# Patient Record
Sex: Female | Born: 1945 | Race: White | Hispanic: No | Smoking: Current every day smoker
Health system: Southern US, Community
[De-identification: ages and names within clinical notes are randomized; demographics above are authoritative.]

## PROBLEM LIST (undated history)

## (undated) DIAGNOSIS — E119 Type 2 diabetes mellitus without complications: Secondary | ICD-10-CM

## (undated) DIAGNOSIS — I10 Essential (primary) hypertension: Secondary | ICD-10-CM

## (undated) DIAGNOSIS — I639 Cerebral infarction, unspecified: Secondary | ICD-10-CM

---

## 2013-07-08 ENCOUNTER — Emergency Department (HOSPITAL_COMMUNITY): Payer: Medicare Other

## 2013-07-08 ENCOUNTER — Emergency Department (HOSPITAL_COMMUNITY)
Admission: EM | Admit: 2013-07-08 | Discharge: 2013-07-08 | Discharge status: Against Medical Advice | Payer: Medicare Other | Attending: Emergency Medicine | Admitting: Emergency Medicine

## 2013-07-08 ENCOUNTER — Encounter (HOSPITAL_COMMUNITY): Payer: Self-pay | Admitting: Emergency Medicine

## 2013-07-08 DIAGNOSIS — E119 Type 2 diabetes mellitus without complications: Secondary | ICD-10-CM | POA: Insufficient documentation

## 2013-07-08 DIAGNOSIS — I10 Essential (primary) hypertension: Secondary | ICD-10-CM | POA: Insufficient documentation

## 2013-07-08 DIAGNOSIS — F17200 Nicotine dependence, unspecified, uncomplicated: Secondary | ICD-10-CM | POA: Insufficient documentation

## 2013-07-08 DIAGNOSIS — G459 Transient cerebral ischemic attack, unspecified: Secondary | ICD-10-CM | POA: Insufficient documentation

## 2013-07-08 HISTORY — DX: Essential (primary) hypertension: I10

## 2013-07-08 HISTORY — DX: Type 2 diabetes mellitus without complications: E11.9

## 2013-07-08 LAB — CBC WITH DIFFERENTIAL/PLATELET
BASOS ABS: 0 10*3/uL (ref 0.0–0.1)
BASOS PCT: 0 % (ref 0–1)
EOS ABS: 0 10*3/uL (ref 0.0–0.7)
EOS PCT: 0 % (ref 0–5)
HCT: 44.5 % (ref 36.0–46.0)
Hemoglobin: 15.6 g/dL — ABNORMAL HIGH (ref 12.0–15.0)
Lymphocytes Relative: 11 % — ABNORMAL LOW (ref 12–46)
Lymphs Abs: 2 10*3/uL (ref 0.7–4.0)
MCH: 31 pg (ref 26.0–34.0)
MCHC: 35.1 g/dL (ref 30.0–36.0)
MCV: 88.5 fL (ref 78.0–100.0)
Monocytes Absolute: 1.2 10*3/uL — ABNORMAL HIGH (ref 0.1–1.0)
Monocytes Relative: 7 % (ref 3–12)
NEUTROS PCT: 81 % — AB (ref 43–77)
Neutro Abs: 14 10*3/uL — ABNORMAL HIGH (ref 1.7–7.7)
PLATELETS: 360 10*3/uL (ref 150–400)
RBC: 5.03 MIL/uL (ref 3.87–5.11)
RDW: 12.6 % (ref 11.5–15.5)
WBC: 17.2 10*3/uL — ABNORMAL HIGH (ref 4.0–10.5)

## 2013-07-08 LAB — URINALYSIS, ROUTINE W REFLEX MICROSCOPIC
BILIRUBIN URINE: NEGATIVE
Glucose, UA: 100 mg/dL — AB
Hgb urine dipstick: NEGATIVE
KETONES UR: NEGATIVE mg/dL
Leukocytes, UA: NEGATIVE
NITRITE: NEGATIVE
Protein, ur: NEGATIVE mg/dL
SPECIFIC GRAVITY, URINE: 1.01 (ref 1.005–1.030)
UROBILINOGEN UA: 0.2 mg/dL (ref 0.0–1.0)
pH: 7.5 (ref 5.0–8.0)

## 2013-07-08 LAB — PROTIME-INR
INR: 0.96 (ref 0.00–1.49)
Prothrombin Time: 12.6 seconds (ref 11.6–15.2)

## 2013-07-08 LAB — COMPREHENSIVE METABOLIC PANEL
ALBUMIN: 3.9 g/dL (ref 3.5–5.2)
ALK PHOS: 96 U/L (ref 39–117)
ALT: 14 U/L (ref 0–35)
AST: 17 U/L (ref 0–37)
BUN: 13 mg/dL (ref 6–23)
CO2: 30 mEq/L (ref 19–32)
Calcium: 9.6 mg/dL (ref 8.4–10.5)
Chloride: 95 mEq/L — ABNORMAL LOW (ref 96–112)
Creatinine, Ser: 0.48 mg/dL — ABNORMAL LOW (ref 0.50–1.10)
GFR calc Af Amer: >90 mL/min (ref >90)
GFR calc non Af Amer: >90 mL/min (ref >90)
Glucose, Bld: 126 mg/dL — ABNORMAL HIGH (ref 70–99)
POTASSIUM: 4.2 meq/L (ref 3.7–5.3)
SODIUM: 135 meq/L — AB (ref 137–147)
TOTAL PROTEIN: 7.6 g/dL (ref 6.0–8.3)
Total Bilirubin: 0.3 mg/dL (ref 0.3–1.2)

## 2013-07-08 LAB — TROPONIN I: Troponin I: <0.3 ng/mL (ref <0.30)

## 2013-07-08 LAB — CBG MONITORING, ED: GLUCOSE-CAPILLARY: 118 mg/dL — AB (ref 70–99)

## 2013-07-08 NOTE — ED Notes (Signed)
[facility] here to transport pt home.

## 2013-07-08 NOTE — ED Provider Notes (Signed)
Pt left ama.  The emtala note is incorrect on this pt  Kim LennertJoseph L Wallie Lagrand, MD 07/08/13 [phone]
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
## 2013-07-08 NOTE — ED Notes (Signed)
Pt able to lift left arm and left leg and hold up without difficulty. Speech clear. Answers questions appropriately. Pt has small facial left side droop. Pt had tele neurology consult.

## 2013-07-08 NOTE — ED Notes (Signed)
Pt sitting up in bed speaking complete sentences without difficulty.speech clear  Answer questions appropriately to date,time, place and president. Denies any pain. Pt states, " I just feel tired" Tele Neurology consulted. Computer in room- awaiting for Dr. To come on screen.

## 2013-07-08 NOTE — ED Notes (Signed)
Pt refusing to stay. Pulling out IV and tele monitor. Dr. Estell HarpinZammit in room and discussed in great length risk of pt leaving. Pt still leaving staying " if its my time to go then its my time"
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
## 2013-07-31 ENCOUNTER — Inpatient Hospital Stay (HOSPITAL_COMMUNITY): Payer: Medicare Other

## 2013-07-31 ENCOUNTER — Encounter (HOSPITAL_COMMUNITY): Payer: Self-pay | Admitting: Emergency Medicine

## 2013-07-31 ENCOUNTER — Emergency Department (HOSPITAL_COMMUNITY): Payer: Medicare Other

## 2013-07-31 ENCOUNTER — Inpatient Hospital Stay (HOSPITAL_COMMUNITY)
Admission: EM | Admit: 2013-07-31 | Discharge: 2013-08-03 | DRG: 064 | Disposition: A | Discharge status: Hospice | Payer: Medicare Other | Attending: Internal Medicine | Admitting: Internal Medicine

## 2013-07-31 DIAGNOSIS — I639 Cerebral infarction, unspecified: Secondary | ICD-10-CM

## 2013-07-31 DIAGNOSIS — E119 Type 2 diabetes mellitus without complications: Secondary | ICD-10-CM

## 2013-07-31 DIAGNOSIS — E43 Unspecified severe protein-calorie malnutrition: Secondary | ICD-10-CM

## 2013-07-31 DIAGNOSIS — R4020 Unspecified coma: Secondary | ICD-10-CM | POA: Diagnosis not present

## 2013-07-31 DIAGNOSIS — I69959 Hemiplegia and hemiparesis following unspecified cerebrovascular disease affecting unspecified side: Secondary | ICD-10-CM

## 2013-07-31 DIAGNOSIS — IMO0002 Reserved for concepts with insufficient information to code with codable children: Secondary | ICD-10-CM | POA: Diagnosis not present

## 2013-07-31 DIAGNOSIS — Z66 Do not resuscitate: Secondary | ICD-10-CM | POA: Diagnosis present

## 2013-07-31 DIAGNOSIS — R29898 Other symptoms and signs involving the musculoskeletal system: Secondary | ICD-10-CM | POA: Diagnosis present

## 2013-07-31 DIAGNOSIS — G936 Cerebral edema: Secondary | ICD-10-CM | POA: Diagnosis present

## 2013-07-31 DIAGNOSIS — J9600 Acute respiratory failure, unspecified whether with hypoxia or hypercapnia: Secondary | ICD-10-CM | POA: Diagnosis not present

## 2013-07-31 DIAGNOSIS — I6521 Occlusion and stenosis of right carotid artery: Secondary | ICD-10-CM | POA: Diagnosis present

## 2013-07-31 DIAGNOSIS — I10 Essential (primary) hypertension: Secondary | ICD-10-CM

## 2013-07-31 DIAGNOSIS — R1310 Dysphagia, unspecified: Secondary | ICD-10-CM | POA: Diagnosis present

## 2013-07-31 DIAGNOSIS — I63239 Cerebral infarction due to unspecified occlusion or stenosis of unspecified carotid arteries: Principal | ICD-10-CM | POA: Diagnosis present

## 2013-07-31 DIAGNOSIS — J690 Pneumonitis due to inhalation of food and vomit: Secondary | ICD-10-CM | POA: Diagnosis not present

## 2013-07-31 DIAGNOSIS — Z602 Problems related to living alone: Secondary | ICD-10-CM

## 2013-07-31 DIAGNOSIS — R4789 Other speech disturbances: Secondary | ICD-10-CM | POA: Diagnosis present

## 2013-07-31 DIAGNOSIS — F17200 Nicotine dependence, unspecified, uncomplicated: Secondary | ICD-10-CM | POA: Diagnosis present

## 2013-07-31 DIAGNOSIS — Z7982 Long term (current) use of aspirin: Secondary | ICD-10-CM

## 2013-07-31 DIAGNOSIS — Z515 Encounter for palliative care: Secondary | ICD-10-CM

## 2013-07-31 DIAGNOSIS — E785 Hyperlipidemia, unspecified: Secondary | ICD-10-CM

## 2013-07-31 DIAGNOSIS — Z720 Tobacco use: Secondary | ICD-10-CM

## 2013-07-31 DIAGNOSIS — Z881 Allergy status to other antibiotic agents status: Secondary | ICD-10-CM

## 2013-07-31 HISTORY — DX: Cerebral infarction, unspecified: I63.9

## 2013-07-31 LAB — CBC
HEMATOCRIT: 47.1 % — AB (ref 36.0–46.0)
HEMOGLOBIN: 16.7 g/dL — AB (ref 12.0–15.0)
MCH: 31.3 pg (ref 26.0–34.0)
MCHC: 35.5 g/dL (ref 30.0–36.0)
MCV: 88.4 fL (ref 78.0–100.0)
Platelets: 244 10*3/uL (ref 150–400)
RBC: 5.33 MIL/uL — ABNORMAL HIGH (ref 3.87–5.11)
RDW: 12.7 % (ref 11.5–15.5)
WBC: 13.2 10*3/uL — ABNORMAL HIGH (ref 4.0–10.5)

## 2013-07-31 LAB — I-STAT CHEM 8, ED
BUN: 14 mg/dL (ref 6–23)
CREATININE: 0.7 mg/dL (ref 0.50–1.10)
Calcium, Ion: 1.27 mmol/L (ref 1.13–1.30)
Chloride: 94 mEq/L — ABNORMAL LOW (ref 96–112)
GLUCOSE: 280 mg/dL — AB (ref 70–99)
HCT: 52 % — ABNORMAL HIGH (ref 36.0–46.0)
Hemoglobin: 17.7 g/dL — ABNORMAL HIGH (ref 12.0–15.0)
Potassium: 4.6 mEq/L (ref 3.7–5.3)
Sodium: 138 mEq/L (ref 137–147)
TCO2: 31 mmol/L (ref 0–100)

## 2013-07-31 LAB — GLUCOSE, CAPILLARY: Glucose-Capillary: 271 mg/dL — ABNORMAL HIGH (ref 70–99)

## 2013-07-31 LAB — DIFFERENTIAL
BASOS PCT: 0 % (ref 0–1)
Basophils Absolute: 0 10*3/uL (ref 0.0–0.1)
EOS ABS: 0 10*3/uL (ref 0.0–0.7)
Eosinophils Relative: 0 % (ref 0–5)
Lymphocytes Relative: 14 % (ref 12–46)
Lymphs Abs: 1.8 10*3/uL (ref 0.7–4.0)
MONOS PCT: 7 % (ref 3–12)
Monocytes Absolute: 0.9 10*3/uL (ref 0.1–1.0)
Neutro Abs: 10.5 10*3/uL — ABNORMAL HIGH (ref 1.7–7.7)
Neutrophils Relative %: 80 % — ABNORMAL HIGH (ref 43–77)

## 2013-07-31 LAB — ETHANOL: Alcohol, Ethyl (B): <11 mg/dL (ref 0–11)

## 2013-07-31 LAB — URINALYSIS, ROUTINE W REFLEX MICROSCOPIC
Bilirubin Urine: NEGATIVE
Glucose, UA: >1000 mg/dL — AB
Hgb urine dipstick: NEGATIVE
Ketones, ur: NEGATIVE mg/dL
Leukocytes, UA: NEGATIVE
Nitrite: NEGATIVE
PROTEIN: NEGATIVE mg/dL
Specific Gravity, Urine: 1.01 (ref 1.005–1.030)
UROBILINOGEN UA: 0.2 mg/dL (ref 0.0–1.0)
pH: 7 (ref 5.0–8.0)

## 2013-07-31 LAB — COMPREHENSIVE METABOLIC PANEL
ALBUMIN: 4.2 g/dL (ref 3.5–5.2)
ALT: 18 U/L (ref 0–35)
AST: 21 U/L (ref 0–37)
Alkaline Phosphatase: 105 U/L (ref 39–117)
BUN: 14 mg/dL (ref 6–23)
CO2: 32 mEq/L (ref 19–32)
CREATININE: 0.5 mg/dL (ref 0.50–1.10)
Calcium: 10.5 mg/dL (ref 8.4–10.5)
Chloride: 96 mEq/L (ref 96–112)
GFR calc Af Amer: >90 mL/min (ref >90)
GFR calc non Af Amer: >90 mL/min (ref >90)
Glucose, Bld: 310 mg/dL — ABNORMAL HIGH (ref 70–99)
Potassium: 5.2 mEq/L (ref 3.7–5.3)
Sodium: 139 mEq/L (ref 137–147)
TOTAL PROTEIN: 7.5 g/dL (ref 6.0–8.3)
Total Bilirubin: 0.5 mg/dL (ref 0.3–1.2)

## 2013-07-31 LAB — RAPID URINE DRUG SCREEN, HOSP PERFORMED
AMPHETAMINES: NOT DETECTED
BARBITURATES: NOT DETECTED
Benzodiazepines: NOT DETECTED
Cocaine: NOT DETECTED
Opiates: NOT DETECTED
TETRAHYDROCANNABINOL: NOT DETECTED

## 2013-07-31 LAB — CBG MONITORING, ED
GLUCOSE-CAPILLARY: 237 mg/dL — AB (ref 70–99)
Glucose-Capillary: 286 mg/dL — ABNORMAL HIGH (ref 70–99)

## 2013-07-31 LAB — PROTIME-INR
INR: 1.01 (ref 0.00–1.49)
Prothrombin Time: 13.1 seconds (ref 11.6–15.2)

## 2013-07-31 LAB — I-STAT TROPONIN, ED: Troponin i, poc: 0 ng/mL (ref 0.00–0.08)

## 2013-07-31 LAB — URINE MICROSCOPIC-ADD ON

## 2013-07-31 LAB — APTT: aPTT: 26 seconds (ref 24–37)

## 2013-07-31 MED ORDER — INSULIN DETEMIR 100 UNIT/ML ~~LOC~~ SOLN
8.0000 [IU] | Freq: Every day | SUBCUTANEOUS | Status: DC
  Administered 2013-07-31 – 2013-08-01 (×2): 8 [IU] via SUBCUTANEOUS
  Filled 2013-07-31 (×3): qty 0.08

## 2013-07-31 MED ORDER — ACETAMINOPHEN 650 MG RE SUPP
650.0000 mg | RECTAL | Status: DC | PRN
  Administered 2013-08-02: 650 mg via RECTAL
  Filled 2013-07-31: qty 1

## 2013-07-31 MED ORDER — HYDROGEN PEROXIDE 3 % EX SOLN
CUTANEOUS | Status: AC
  Filled 2013-07-31: qty 473

## 2013-07-31 MED ORDER — METFORMIN HCL 500 MG PO TABS
1000.0000 mg | ORAL_TABLET | Freq: Two times a day (BID) | ORAL | Status: DC
  Filled 2013-07-31 (×5): qty 2

## 2013-07-31 MED ORDER — SODIUM CHLORIDE 0.9 % IV SOLN
Freq: Once | INTRAVENOUS | Status: AC
  Administered 2013-07-31: 20:00:00 via INTRAVENOUS

## 2013-07-31 MED ORDER — IBUPROFEN 200 MG PO TABS
200.0000 mg | ORAL_TABLET | Freq: Every day | ORAL | Status: DC
  Filled 2013-07-31 (×3): qty 1

## 2013-07-31 MED ORDER — DIPHENHYDRAMINE HCL 25 MG PO TABS: 50.0000 mg | ORAL_TABLET | Freq: Every day | ORAL | Status: DC

## 2013-07-31 MED ORDER — LOPERAMIDE HCL 2 MG PO TABS
4.0000 mg | ORAL_TABLET | Freq: Four times a day (QID) | ORAL | Status: DC | PRN
  Filled 2013-07-31: qty 2

## 2013-07-31 MED ORDER — IOHEXOL 350 MG/ML SOLN
80.0000 mL | Freq: Once | INTRAVENOUS | Status: AC | PRN
  Administered 2013-07-31: 80 mL via INTRAVENOUS

## 2013-07-31 MED ORDER — ASPIRIN 325 MG PO TABS
325.0000 mg | ORAL_TABLET | Freq: Every day | ORAL | Status: DC
  Filled 2013-07-31 (×3): qty 1

## 2013-07-31 MED ORDER — AMLODIPINE BESYLATE 10 MG PO TABS
10.0000 mg | ORAL_TABLET | Freq: Every day | ORAL | Status: DC
  Filled 2013-07-31 (×2): qty 1

## 2013-07-31 MED ORDER — ASPIRIN 300 MG RE SUPP
300.0000 mg | Freq: Every day | RECTAL | Status: DC
  Administered 2013-07-31 – 2013-08-02 (×3): 300 mg via RECTAL
  Filled 2013-07-31 (×3): qty 1

## 2013-07-31 MED ORDER — MORPHINE SULFATE 2 MG/ML IJ SOLN
2.0000 mg | Freq: Once | INTRAMUSCULAR | Status: AC
  Administered 2013-07-31: 2 mg via INTRAVENOUS
  Filled 2013-07-31: qty 1

## 2013-07-31 MED ORDER — GLYBURIDE 5 MG PO TABS
5.0000 mg | ORAL_TABLET | Freq: Every day | ORAL | Status: DC
  Filled 2013-07-31 (×2): qty 1

## 2013-07-31 MED ORDER — IRBESARTAN 300 MG PO TABS
300.0000 mg | ORAL_TABLET | Freq: Every day | ORAL | Status: DC
  Filled 2013-07-31 (×2): qty 1

## 2013-07-31 MED ORDER — ACETAMINOPHEN 325 MG PO TABS: 650.0000 mg | ORAL_TABLET | ORAL | Status: DC | PRN

## 2013-07-31 MED ORDER — VITAMIN D3 25 MCG (1000 UNIT) PO TABS
1000.0000 [IU] | ORAL_TABLET | Freq: Every day | ORAL | Status: DC
  Filled 2013-07-31 (×2): qty 1

## 2013-07-31 MED ORDER — HEPARIN SODIUM (PORCINE) 5000 UNIT/ML IJ SOLN
5000.0000 [IU] | Freq: Three times a day (TID) | INTRAMUSCULAR | Status: DC
  Administered 2013-07-31 – 2013-08-02 (×5): 5000 [IU] via SUBCUTANEOUS
  Filled 2013-07-31 (×8): qty 1

## 2013-07-31 MED ORDER — SENNOSIDES-DOCUSATE SODIUM 8.6-50 MG PO TABS
1.0000 | ORAL_TABLET | Freq: Every evening | ORAL | Status: DC | PRN
  Filled 2013-07-31: qty 1

## 2013-07-31 MED ORDER — ZOLPIDEM TARTRATE 5 MG PO TABS: 5.0000 mg | ORAL_TABLET | Freq: Every day | ORAL | Status: DC

## 2013-07-31 NOTE — ED Notes (Signed)
Spoke with [facility].  They are en route and will be here in 20 minutes

## 2013-07-31 NOTE — ED Notes (Signed)
Teleneuro consult in progress 

## 2013-07-31 NOTE — ED Notes (Addendum)
Tele neurology ppwrk faxed (by secretary)/recieved

## 2013-07-31 NOTE — ED Notes (Signed)
Pt brought to ER by EMS, pt's family states the pt was well last night, today while speaking on the phone the pt went unresponsive around 1300. The exact time of onset of symptoms are unknown. The pt is staring to the right and fidgets to the right. Pt has residual lt sided droop from prior CVA and lt sided flaccidity. EMS took the pt to CT per code stroke requirements.

## 2013-07-31 NOTE — ED Notes (Signed)
Await [facility].  Per previous shift transport has been called and attempt to call report to [facility] was made, will call report once [facility] is en route

## 2013-07-31 NOTE — ED Notes (Signed)
Family at the bedside, MD to speak with them

## 2013-07-31 NOTE — ED Notes (Signed)
RN at bedside awaiting tele neurology consult.
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
## 2013-07-31 NOTE — ED Notes (Signed)
Tele neurology disconnected.

## 2013-07-31 NOTE — ED Notes (Signed)
750cc clear yellow urine emptied from foley

## 2013-07-31 NOTE — ED Notes (Signed)
Pt complaining of headache, MD aware, orders given 

## 2013-07-31 NOTE — ED Provider Notes (Signed)
CSN: [id number]     Arrival date & time 07/31/13  1414 History  This chart was scribed for Donnetta Hutching, MD by Bronson Curb, ED Scribe. This patient was seen in room APA02/APA02 and the patient's care was started at 2:24 PM.    Chief Complaint  Patient presents with  . Code Stroke    The history is provided by the EMS personnel and the patient. No language interpreter was used.   HPI Comments:  Level 5 caveat for code stroke Kim Chen is a 68 y.o. female brought in by ambulance, who presents to the Emergency Department complaining of slurred speech, weakness on the right side, right-sided gaze.    Patient was apparently talking to his sister approximately 1 PM when her sister noted patient had slurred speech. EMS was notified. They had to break in the house. On initial exam, the patient was staring to the right and complaining of right-sided weakness. She is chronically weak on the left side secondary to a stroke many years ago.  Past Medical History  Diagnosis Date  . Hypertension   . Diabetes mellitus without complication   . Stroke    History reviewed. No pertinent past surgical history. History reviewed. No pertinent family history. History  Substance Use Topics  . Smoking status: Current Every Day Smoker  . Smokeless tobacco: Not on file  . Alcohol Use: No   OB History   Grav Para Term Preterm Abortions TAB SAB Ect Mult Living                 Review of Systems  Unable to perform ROS  A complete 10 system review of systems was obtained and all systems are negative except as noted in the HPI and PMH.      Allergies  Ampicillin  Home Medications   Current Outpatient Rx  Name  Route  Sig  Dispense  Refill  . amLODipine (NORVASC) 10 MG tablet   Oral   Take 10 mg by mouth daily.         . cholecalciferol (VITAMIN D) 1000 UNITS tablet   Oral   Take 1,000 Units by mouth daily.         . diphenhydrAMINE (BENADRYL) 25 MG tablet   Oral   Take 50 mg by  mouth at bedtime.         Marland Kitchen glyBURIDE (DIABETA) 5 MG tablet   Oral   Take 5 mg by mouth daily with breakfast.         . ibuprofen (ADVIL,MOTRIN) 200 MG tablet   Oral   Take 200 mg by mouth daily.         . insulin detemir (LEVEMIR) 100 UNIT/ML injection   Subcutaneous   Inject 8-10 Units into the skin at bedtime.         Marland Kitchen loperamide (IMODIUM A-D) 2 MG tablet   Oral   Take 4 mg by mouth 4 (four) times daily as needed for diarrhea or loose stools.          . metFORMIN (GLUCOPHAGE) 500 MG tablet   Oral   Take by mouth 2 (two) times daily with a meal.         . Multiple Vitamin (MULTIVITAMIN WITH MINERALS) TABS tablet   Oral   Take 1 tablet by mouth daily.         Marland Kitchen olmesartan (BENICAR) 40 MG tablet   Oral   Take 20 mg by mouth daily.  BP 162/73  Pulse 89  Temp(Src) 98.2 F (36.8 C)  Resp 20  Ht 4\' 11"  (1.499 m)  Wt 90 lb (40.824 kg)  BMI 18.17 kg/m2  SpO2 95% Physical Exam  Nursing note and vitals reviewed. Constitutional:  No slurred speech noted  HENT:  Head: Normocephalic and atraumatic.  Right-sided gaze  Eyes: Conjunctivae and EOM are normal. Pupils are equal, round, and reactive to light.  Neck: Normal range of motion. Neck supple.  Cardiovascular: Normal rate, regular rhythm and normal heart sounds.   Pulmonary/Chest: Effort normal and breath sounds normal.  Abdominal: Soft. Bowel sounds are normal.  Musculoskeletal: Normal range of motion.  Neurological:  Can minimally move right arm and leg. Left arm and leg chronically weak secondary to stroke  Skin: Skin is warm and dry.  Psychiatric: She has a normal mood and affect. Her behavior is normal.    ED Course  Procedures (including critical care time) DIAGNOSTIC STUDIES: Oxygen Saturation is 100% on 2 liters which is normal.  COORDINATION OF CARE: 2:29 PM- Will admit to hospital.Pt advised of plan for treatment and pt agrees.    Labs Review Labs Reviewed  CBC -  Abnormal; Notable for the following:    WBC 13.2 (*)    RBC 5.33 (*)    Hemoglobin 16.7 (*)    HCT 47.1 (*)    All other components within normal limits  DIFFERENTIAL - Abnormal; Notable for the following:    Neutrophils Relative % 80 (*)    Neutro Abs 10.5 (*)    All other components within normal limits  COMPREHENSIVE METABOLIC PANEL - Abnormal; Notable for the following:    Glucose, Bld 310 (*)    All other components within normal limits  CBG MONITORING, ED - Abnormal; Notable for the following:    Glucose-Capillary 286 (*)    All other components within normal limits  I-STAT CHEM 8, ED - Abnormal; Notable for the following:    Chloride 94 (*)    Glucose, Bld 280 (*)    Hemoglobin 17.7 (*)    HCT 52.0 (*)    All other components within normal limits  PROTIME-INR  APTT  ETHANOL  URINE RAPID DRUG SCREEN (HOSP PERFORMED)  URINALYSIS, ROUTINE W REFLEX MICROSCOPIC  I-STAT TROPOININ, ED  CBG MONITORING, ED  I-STAT CHEM 8, ED  I-STAT TROPOININ, ED  I-STAT TROPOININ, ED   Imaging Review Ct Angio Head W/cm &/or Wo Cm  07/31/2013   CLINICAL DATA:  Code stroke, evaluate.  EXAM: CT ANGIOGRAPHY HEAD  TECHNIQUE: Multidetector CT imaging of the head was performed using the standard protocol during bolus administration of intravenous contrast. Multiplanar CT image reconstructions and MIPs were obtained to evaluate the vascular anatomy.  CONTRAST:  80mL OMNIPAQUE IOHEXOL 350 MG/ML SOLN  COMPARISON:  CT HEAD W/O CM dated 07/31/2013  FINDINGS: Anterior circulation: Complete loss of contrast opacification of the included right cervical, petrous internal carotid artery. Mild hypo dense contrast opacification of the right distal cavernous internal carotid artery, with apparent high-grade stenosis of the anterior genu of the right internal carotid artery, axial 35/79. Robust right carotid terminus contrast opacification, with tiny posterior communicating artery and diminutive but patent left A1  segment. Robust left posterior communicating artery is present. Supernumerary anterior cerebral artery with patent proximal anterior and middle cerebral arteries, however there is paucity of distal right middle cerebral artery and anterior cerebral artery vessels, which may slow flow.  Posterior circulation: Right vertebral artery is dominant, the left  vertebral artery predominantly terminates in left posterior-inferior cerebellar artery. Patent basilar artery and main branch vessels. Normal appearance of the posterior cerebral arteries.  No aneurysm, no suspicious luminal irregularity.  Slight subcentimeter wedge-like enhancement of the right occipital (axial 20/32, series 7) and right inferior frontal lobe (axial 19/32, series 7), of unclear clinical significance.  Review of the MIP images confirms the above findings.  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 This could be further characterized with dedicated CT angiogram of the neck.  Apparent high-grade stenosis of the anterior genu of the right internal carotid artery.  Relative paucity of distal anterior and middle cerebral artery vessels, favored to reflect delayed flow due to the proximal ICA occlusion.  Very slight enhancement of the right inferior frontal lobe and right occipital lung may reflect partial voluming of cortical veins or possibly subacute ischemia, and would be better characterized on MRI of the brain with contrast as clinically indicated.  Acute findings discussed with and reconfirmed by [facility] on4/10/2015at4:55 PM.   Electronically Signed   By: Awilda Metro   On: 07/31/2013 17:01   Ct Head Wo Contrast  07/31/2013   CLINICAL DATA:  Code stroke, sudden weakness, slurred speech  EXAM: CT HEAD WITHOUT CONTRAST  TECHNIQUE: Contiguous axial images were obtained from the base of the skull through the vertex without intravenous contrast.  COMPARISON:   07/08/2013  FINDINGS: Mild generalized atrophy.  Normal ventricular morphology.  No midline shift or mass effect.  Scattered small vessel chronic ischemic changes of deep cerebral white matter, most focal in right parietal region, stable.  No intracranial hemorrhage, mass lesion or evidence acute infarction.  No extra-axial fluid collections.  Atherosclerotic calcification at the carotid siphons bilaterally.  Nasal septal deviation to the left.  No acute bone or sinus abnormalities.  IMPRESSION: Small vessel chronic ischemic changes of deep cerebral white matter.  No acute intracranial abnormalities.  Critical Value/emergent results were called by telephone at the time of interpretation on 07/31/2013 at 1423 hr to Dr. Donnetta Hutching , who verbally acknowledged these results.   Electronically Signed   By: Ulyses Southward M.D.   On: 07/31/2013 14:31     EKG Interpretation   Date/Time:  Friday July 31 2013 14:23:52 EDT Ventricular Rate:  99 PR Interval:  140 QRS Duration: 72 QT Interval:  366 QTC Calculation: 469 R Axis:   86 Text Interpretation:  Normal sinus rhythm Cannot rule out Anterior infarct  , age undetermined Abnormal ECG When compared with ECG of 08-Jul-2013  12:18, ST no longer depressed in Inferior leads T wave inversion no longer  evident in Inferior leads Confirmed by Adriana Simas  MD, Damarcus Reggio ([id number]) on  07/31/2013 2:50:30 PM     CRITICAL CARE Performed by: Donnetta Hutching Total critical care time:40 Critical care time was exclusive of separately billable procedures and treating other patients. Critical care was necessary to treat or prevent imminent or life-threatening deterioration. Critical care was time spent personally by me on the following activities: development of treatment plan with patient and/or surrogate as well as nursing, discussions with consultants, evaluation of patient's response to treatment, examination of patient, obtaining history from patient or surrogate, ordering and  performing treatments and interventions, ordering and review of laboratory studies, ordering and review of radiographic studies, pulse oximetry and re-evaluation of patient's condition. MDM   Final diagnoses:  Acute embolic stroke  Diabetes  Hypertension   Code stroke called immediately. Discussed clinical  scenario with tele-neurologist and [facility]. Also discussed with medical hospitalist. CT scan confirms a stroke. [facility]   I personally performed the services described in this documentation, which was scribed in my presence. The recorded information has been reviewed and is accurate.      Donnetta HutchingBrian Laray Rivkin, MD 07/31/13 [phone]

## 2013-07-31 NOTE — Consult Note (Signed)
Referring Physician: [facility]    Chief Complaint: right hemiparesis, right gaze preference, decreased responsiveness  HPI:                                                                                                                                         Kim Chen is an 68 y.o. 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 She was initially brought by ambulance to [facility] due to acute onset confusion with right sided weakness, slurred speech, and right gaze preference. Conflicting information about when she was last known well, but one of her family members is at the bedside and told me that she was fine and able to work on Wednesday. However, review of her clinical record indicated that she was seen  in the ED on 3/18 with similar complaints and altered mental status while driving around in the [place] parking lot. In any case, patient was apparently talking to his sister approximately 1 PM today when her sister noted patient had slurred speech. EMS was notified and they had to break in the house. She was found on the floor but conscious. A code stroke was activated at [place]. CT brain showed no acute abnormality. She improved while in he ED and after discussion with tele neurology she was deemed not a candidate for thrombolysis.  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 Presently, complains of feeling hungry and having neck soreness but denies HA, vertigo, double vision, visual disturbances, chest pain, or palpitations.  Date last known well: uncertain  Time last known well: uncertain tPA Given: no, unclear time of onset   Past Medical History  Diagnosis  Date  . Hypertension   . Stroke   . Diabetes mellitus without complication     History reviewed. No pertinent past surgical history.  History reviewed. No pertinent family history. Social History:  reports that she has been smoking.  She does not have any smokeless tobacco history on file. She reports that she does not drink alcohol or use illicit drugs.  Allergies:  Allergies  Allergen Reactions  . Ampicillin Other (See Comments)    unknown    Medications:  I have reviewed the patient's current medications.  ROS:                                                                                                                                       History obtained from chart review and family  General ROS: negative for - chills, fatigue, fever, night sweats, weight gain or weight loss Psychological ROS: negative for - behavioral disorder, hallucinations, memory difficulties, mood swings or suicidal ideation Ophthalmic ROS: negative for - blurry vision, double vision, eye pain or loss of vision ENT ROS: negative for - epistaxis, nasal discharge, oral lesions, sore throat, tinnitus or vertigo Allergy and Immunology ROS: negative for - hives or itchy/watery eyes Hematological and Lymphatic ROS: negative for - bleeding problems, bruising or swollen lymph nodes Endocrine ROS: negative for - galactorrhea, hair pattern changes, polydipsia/polyuria or temperature intolerance Respiratory ROS: negative for - cough, hemoptysis, shortness of breath or wheezing Cardiovascular ROS: negative for - chest pain, dyspnea on exertion, edema or irregular heartbeat Gastrointestinal ROS: negative for - abdominal pain, diarrhea, hematemesis, nausea/vomiting or stool incontinence Genito-Urinary ROS: negative for - dysuria, hematuria, incontinence or urinary  frequency/urgency Musculoskeletal ROS: negative for - joint swelling  Neurological ROS: as noted in HPI Dermatological ROS: negative for rash and skin lesion changes   Physical exam: pleasant female in no apparent distress. Blood pressure 172/75, pulse 88, temperature 98.2 F (36.8 C), resp. rate 14, height 4' 11"  (1.499 m), weight 40.824 kg (90 lb), SpO2 100.00%. Head: normocephalic. Neck: supple, no bruits, no JVD. Cardiac: no murmurs. Lungs: clear. Abdomen: soft, no tender, no mass. Extremities: no edema. CV: pulses palpable throughout Neurologic Examination:                                                                                                      Mental Status: Alert, awake,oriented x 3. Speech fluent without evidence of aphasia.  Able to follow 3 step commands without difficulty. Cranial Nerves: II: Discs flat bilaterally; Visual fields grossly normal, pupils equal, round, reactive to light and accommodation III,IV, VI: ptosis not present, seems to have a right gaze preference. 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 Mild weakness of the right side, although patient is not fully cooperative on exam. Tone and bulk:normal tone throughout; no atrophy  noted Sensory: Pinprick and light touch intact throughout, bilaterally Deep Tendon Reflexes:  Slightly brisker on the left. Plantars: Right: upgoing   Left: downgoing Cerebellar: Patient is uncooperative for this exam. Gait:  Unable to test.    Results for orders placed during the hospital encounter of 07/31/13 (from the past 48 hour(s))  CBG MONITORING, ED     Status: Abnormal   Collection Time    07/31/13  2:24 PM      Result Value Ref Range    Glucose-Capillary 286 (*) 70 - 99 mg/dL  PROTIME-INR     Status: None   Collection Time    07/31/13  2:25 PM      Result Value Ref Range   Prothrombin Time 13.1  11.6 - 15.2 seconds   INR 1.01  0.00 - 1.49  APTT     Status: None   Collection Time    07/31/13  2:25 PM      Result Value Ref Range   aPTT 26  24 - 37 seconds  CBC     Status: Abnormal   Collection Time    07/31/13  2:25 PM      Result Value Ref Range   WBC 13.2 (*) 4.0 - 10.5 K/uL   RBC 5.33 (*) 3.87 - 5.11 MIL/uL   Hemoglobin 16.7 (*) 12.0 - 15.0 g/dL   HCT 47.1 (*) 36.0 - 46.0 %   MCV 88.4  78.0 - 100.0 fL   MCH 31.3  26.0 - 34.0 pg   MCHC 35.5  30.0 - 36.0 g/dL   RDW 12.7  11.5 - 15.5 %   Platelets 244  150 - 400 K/uL  DIFFERENTIAL     Status: Abnormal   Collection Time    07/31/13  2:25 PM      Result Value Ref Range   Neutrophils Relative % 80 (*) 43 - 77 %   Neutro Abs 10.5 (*) 1.7 - 7.7 K/uL   Lymphocytes Relative 14  12 - 46 %   Lymphs Abs 1.8  0.7 - 4.0 K/uL   Monocytes Relative 7  3 - 12 %   Monocytes Absolute 0.9  0.1 - 1.0 K/uL   Eosinophils Relative 0  0 - 5 %   Eosinophils Absolute 0.0  0.0 - 0.7 K/uL   Basophils Relative 0  0 - 1 %   Basophils Absolute 0.0  0.0 - 0.1 K/uL  COMPREHENSIVE METABOLIC PANEL     Status: Abnormal   Collection Time    07/31/13  2:25 PM      Result Value Ref Range   Sodium 139  137 - 147 mEq/L   Potassium 5.2  3.7 - 5.3 mEq/L   Chloride 96  96 - 112 mEq/L   CO2 32  19 - 32 mEq/L   Glucose, Bld 310 (*) 70 - 99 mg/dL   BUN 14  6 - 23 mg/dL   Creatinine, Ser 0.50  0.50 - 1.10 mg/dL   Calcium 10.5  8.4 - 10.5 mg/dL   Total Protein 7.5  6.0 - 8.3 g/dL   Albumin 4.2  3.5 - 5.2 g/dL   AST 21  0 - 37 U/L   ALT 18  0 - 35 U/L   Alkaline Phosphatase 105  39 - 117 U/L   Total Bilirubin 0.5  0.3 - 1.2 mg/dL   GFR calc non Af Amer >90  >90 mL/min   GFR calc Af Amer >90  >90 mL/min  Comment: (NOTE)     The eGFR has been calculated using the CKD EPI equation.     This  calculation has not been validated in all clinical situations.     eGFR's persistently <90 mL/min signify possible Chronic Kidney     Disease.  ETHANOL     Status: None   Collection Time    07/31/13  2:32 PM      Result Value Ref Range   Alcohol, Ethyl (B) <11  0 - 11 mg/dL   Comment:            LOWEST DETECTABLE LIMIT FOR     SERUM ALCOHOL IS 11 mg/dL     FOR MEDICAL PURPOSES ONLY  I-STAT CHEM 8, ED     Status: Abnormal   Collection Time    07/31/13  2:35 PM      Result Value Ref Range   Sodium 138  137 - 147 mEq/L   Potassium 4.6  3.7 - 5.3 mEq/L   Chloride 94 (*) 96 - 112 mEq/L   BUN 14  6 - 23 mg/dL   Creatinine, Ser 0.70  0.50 - 1.10 mg/dL   Glucose, Bld 280 (*) 70 - 99 mg/dL   Calcium, Ion 1.27  1.13 - 1.30 mmol/L   TCO2 31  0 - 100 mmol/L   Hemoglobin 17.7 (*) 12.0 - 15.0 g/dL   HCT 52.0 (*) 36.0 - 46.0 %  I-STAT TROPOININ, ED     Status: None   Collection Time    07/31/13  2:52 PM      Result Value Ref Range   Troponin i, poc 0.00  0.00 - 0.08 ng/mL   Comment 3            Comment: Due to the release kinetics of cTnI,     a negative result within the first hours     of the onset of symptoms does not rule out     myocardial infarction with certainty.     If myocardial infarction is still suspected,     repeat the test at appropriate intervals.  URINE RAPID DRUG SCREEN (HOSP PERFORMED)     Status: None   Collection Time    07/31/13  5:14 PM      Result Value Ref Range   Opiates NONE DETECTED  NONE DETECTED   Cocaine NONE DETECTED  NONE DETECTED   Benzodiazepines NONE DETECTED  NONE DETECTED   Amphetamines NONE DETECTED  NONE DETECTED   Tetrahydrocannabinol NONE DETECTED  NONE DETECTED   Barbiturates NONE DETECTED  NONE DETECTED   Comment:            DRUG SCREEN FOR MEDICAL PURPOSES     ONLY.  IF CONFIRMATION IS NEEDED     FOR ANY PURPOSE, NOTIFY LAB     WITHIN 5 DAYS.                LOWEST DETECTABLE LIMITS     FOR URINE DRUG SCREEN     Drug Class        Cutoff (ng/mL)     Amphetamine      1000     Barbiturate      200     Benzodiazepine   366     Tricyclics       294     Opiates          300     Cocaine  300     THC              50  URINALYSIS, ROUTINE W REFLEX MICROSCOPIC     Status: Abnormal   Collection Time    07/31/13  5:14 PM      Result Value Ref Range   Color, Urine YELLOW  YELLOW   APPearance CLEAR  CLEAR   Specific Gravity, Urine 1.010  1.005 - 1.030   pH 7.0  5.0 - 8.0   Glucose, UA >1000 (*) NEGATIVE mg/dL   Hgb urine dipstick NEGATIVE  NEGATIVE   Bilirubin Urine NEGATIVE  NEGATIVE   Ketones, ur NEGATIVE  NEGATIVE mg/dL   Protein, ur NEGATIVE  NEGATIVE mg/dL   Urobilinogen, UA 0.2  0.0 - 1.0 mg/dL   Nitrite NEGATIVE  NEGATIVE   Leukocytes, UA NEGATIVE  NEGATIVE  URINE MICROSCOPIC-ADD ON     Status: Abnormal   Collection Time    07/31/13  5:14 PM      Result Value Ref Range   Squamous Epithelial / LPF RARE  RARE   Bacteria, UA MANY (*) RARE  CBG MONITORING, ED     Status: Abnormal   Collection Time    07/31/13  7:25 PM      Result Value Ref Range   Glucose-Capillary 237 (*) 70 - 99 mg/dL  GLUCOSE, CAPILLARY     Status: Abnormal   Collection Time    07/31/13  9:49 PM      Result Value Ref Range   Glucose-Capillary 271 (*) 70 - 99 mg/dL   Comment 1 Notify RN     Comment 2 Documented in Chart     Ct Angio Head W/cm &/or Wo Cm  07/31/2013   CLINICAL DATA:  Code stroke, evaluate.  EXAM: CT ANGIOGRAPHY HEAD  TECHNIQUE: Multidetector CT imaging of the head was performed using the standard protocol during bolus administration of intravenous contrast. Multiplanar CT image reconstructions and MIPs were obtained to evaluate the vascular anatomy.  CONTRAST:  29m OMNIPAQUE IOHEXOL 350 MG/ML SOLN  COMPARISON:  CT HEAD W/O CM dated 07/31/2013  FINDINGS: Anterior circulation: Complete loss of contrast opacification of the included right cervical, petrous internal carotid artery. Mild hypo dense contrast opacification  of the right distal cavernous internal carotid artery, with apparent high-grade stenosis of the anterior genu of the right internal carotid artery, axial 35/79. Robust right carotid terminus contrast opacification, with tiny posterior communicating artery and diminutive but patent left A1 segment. Robust left posterior communicating artery is present. Supernumerary anterior cerebral artery with patent proximal anterior and middle cerebral arteries, however there is paucity of distal right middle cerebral artery and anterior cerebral artery vessels, which may slow flow.  Posterior circulation: Right vertebral artery is dominant, the left vertebral artery predominantly terminates in left posterior-inferior cerebellar artery. Patent basilar artery and main branch vessels. Normal appearance of the posterior cerebral arteries.  No aneurysm, no suspicious luminal irregularity.  Slight subcentimeter wedge-like enhancement of the right occipital (axial 20/32, series 7) and right inferior frontal lobe (axial 19/32, series 7), of unclear clinical significance.  Review of the MIP images confirms the above findings.  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 This could be further characterized with dedicated CT angiogram of the neck.  Apparent high-grade stenosis of the anterior genu of the right internal carotid artery.  Relative paucity of distal anterior and middle cerebral artery vessels, favored to reflect delayed flow due to  the proximal ICA occlusion.  Very slight enhancement of the right inferior frontal lobe and right occipital lung may reflect partial voluming of cortical veins or possibly subacute ischemia, and would be better characterized on MRI of the brain with contrast as clinically indicated.  Acute findings discussed with and reconfirmed by [facility] COOK on4/10/2015at4:55 PM.   Electronically Signed   By: Elon Alas   On:  07/31/2013 17:01   Ct Head Wo Contrast  07/31/2013   CLINICAL DATA:  Code stroke, sudden weakness, slurred speech  EXAM: CT HEAD WITHOUT CONTRAST  TECHNIQUE: Contiguous axial images were obtained from the base of the skull through the vertex without intravenous contrast.  COMPARISON:  07/08/2013  FINDINGS: Mild generalized atrophy.  Normal ventricular morphology.  No midline shift or mass effect.  Scattered small vessel chronic ischemic changes of deep cerebral white matter, most focal in right parietal region, stable.  No intracranial hemorrhage, mass lesion or evidence acute infarction.  No extra-axial fluid collections.  Atherosclerotic calcification at the carotid siphons bilaterally.  Nasal septal deviation to the left.  No acute bone or sinus abnormalities.  IMPRESSION: Small vessel chronic ischemic changes of deep cerebral white matter.  No acute intracranial abnormalities.  Critical Value/emergent results were called by telephone at the time of interpretation on 07/31/2013 at 1423 hr to Dr. Nat Christen , who verbally acknowledged these results.   Electronically Signed   By: Lavonia Dana M.D.   On: 07/31/2013 14:31     Assessment: 68 y.o. female with several risk factors for stroke and new onset of a neurological syndrome concerning for a right hemispheric stroke. CT brain showed no acute abnormality. 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 She was not a candidate for thrombolysis due to unclear time of onset of her symptoms. Complete stroke work up. Aspirin rectally, as she failed bedside swallowing evaluation. Stroke team will resume care in the morning.   Stroke Risk Factors - HTN, DM, stroke  Plan: 1. HgbA1c, fasting lipid panel 2. MRI, MRA  of the brain without contrast 3. Echocardiogram 4. Carotid dopplers 5.  Prophylactic therapy-aspirin rectally 6. Risk factor modification 7. Telemetry monitoring 8. Frequent neuro checks 9. PT/OT SLP   Dorian Pod, MD [facility] [phone]  07/31/2013, 11:19 PM

## 2013-07-31 NOTE — ED Notes (Signed)
Will check about IV fluids as pt is NPO due to failed swallow test

## 2013-07-31 NOTE — ED Notes (Signed)
Tele neurology consult with Dr. Rosezella RumpfMehlman, pt not a candidate for iv TPA.

## 2013-07-31 NOTE — ED Notes (Signed)
Call to 3S to give report 

## 2013-08-01 ENCOUNTER — Inpatient Hospital Stay (HOSPITAL_COMMUNITY): Payer: Medicare Other

## 2013-08-01 DIAGNOSIS — E119 Type 2 diabetes mellitus without complications: Secondary | ICD-10-CM

## 2013-08-01 DIAGNOSIS — I517 Cardiomegaly: Secondary | ICD-10-CM

## 2013-08-01 DIAGNOSIS — Z720 Tobacco use: Secondary | ICD-10-CM | POA: Diagnosis present

## 2013-08-01 DIAGNOSIS — E785 Hyperlipidemia, unspecified: Secondary | ICD-10-CM | POA: Diagnosis present

## 2013-08-01 DIAGNOSIS — I6340 Cerebral infarction due to embolism of unspecified cerebral artery: Secondary | ICD-10-CM

## 2013-08-01 DIAGNOSIS — E43 Unspecified severe protein-calorie malnutrition: Secondary | ICD-10-CM | POA: Diagnosis present

## 2013-08-01 DIAGNOSIS — F17200 Nicotine dependence, unspecified, uncomplicated: Secondary | ICD-10-CM

## 2013-08-01 DIAGNOSIS — I10 Essential (primary) hypertension: Secondary | ICD-10-CM

## 2013-08-01 LAB — MRSA PCR SCREENING: MRSA BY PCR: NEGATIVE

## 2013-08-01 LAB — LIPID PANEL
CHOL/HDL RATIO: 3 ratio
Cholesterol: 171 mg/dL (ref 0–200)
HDL: 57 mg/dL (ref >39)
LDL CALC: 88 mg/dL (ref 0–99)
TRIGLYCERIDES: 131 mg/dL (ref <150)
VLDL: 26 mg/dL (ref 0–40)

## 2013-08-01 LAB — GLUCOSE, CAPILLARY
Glucose-Capillary: 119 mg/dL — ABNORMAL HIGH (ref 70–99)
Glucose-Capillary: 161 mg/dL — ABNORMAL HIGH (ref 70–99)
Glucose-Capillary: 219 mg/dL — ABNORMAL HIGH (ref 70–99)
Glucose-Capillary: 206 mg/dL — ABNORMAL HIGH (ref 70–99)

## 2013-08-01 LAB — HEMOGLOBIN A1C
HEMOGLOBIN A1C: 12.2 % — AB (ref <5.7)
Mean Plasma Glucose: 303 mg/dL — ABNORMAL HIGH (ref <117)

## 2013-08-01 MED ORDER — LORAZEPAM 2 MG/ML IJ SOLN
1.0000 mg | INTRAMUSCULAR | Status: DC | PRN
  Administered 2013-08-01: 1 mg via INTRAVENOUS
  Filled 2013-08-01 (×2): qty 1

## 2013-08-01 MED ORDER — WHITE PETROLATUM GEL
Status: AC
  Administered 2013-08-01: 0.2
  Filled 2013-08-01: qty 5

## 2013-08-01 MED ORDER — INSULIN ASPART 100 UNIT/ML ~~LOC~~ SOLN
0.0000 [IU] | SUBCUTANEOUS | Status: DC
  Administered 2013-08-02: 5 [IU] via SUBCUTANEOUS

## 2013-08-01 NOTE — Progress Notes (Signed)
INITIAL NUTRITION ASSESSMENT  DOCUMENTATION CODES Per approved criteria  -Severe malnutrition in the context of chronic illness -Underweight   INTERVENTION: Supplement diet as appropriate.   NUTRITION DIAGNOSIS: Malnutrition related to chronic illness as evidenced by 11% weight loss x 1 month and severe muscle and fat wasting.   Goal: Pt to meet >/= 90% of their estimated nutrition needs   Monitor:  Diet advancement, po intake, weight trend  Reason for Assessment: Pt identified as at nutrition risk on the Malnutrition Screen Tool  68 y.o. female  Admitting Dx: Acute embolic stroke  ASSESSMENT: Pt admitted from home where she lives alone with large right brain stroke. Per daughter pt has been losing weight despite eating well. Daughter reports that pt has had DM for years but Dec 2014 she stopped taking her oral medications. She works at a [employer] and had been eating a lot of sweets since Dec 2014. Pt started insulin about a month ago. It appears that weight loss has been from poorly controlled DM.   Nutrition Focused Physical Exam:  Subcutaneous Fat:  Orbital Region: WNL Upper Arm Region: severe wasting Thoracic and Lumbar Region: severe wasting  Muscle:  Temple Region: WNL Clavicle Bone Region: severe wasting Clavicle and Acromion Bone Region: WNl Scapular Bone Region: severe wasting Dorsal Hand: severe wasting Patellar Region: WNL Anterior Thigh Region: severe wasting Posterior Calf Region: severe wasting  Edema: not present   Height: Ht Readings from Last 1 Encounters:  07/31/13 4\' 11"  (1.499 m)    Weight: Wt Readings from Last 1 Encounters:  07/31/13 85 lb 8.6 oz (38.8 kg)    Ideal Body Weight: 44.6 kg   % Ideal Body Weight: 87%  Wt Readings from Last 10 Encounters:  07/31/13 85 lb 8.6 oz (38.8 kg)  07/08/13 95 lb (43.092 kg)    Usual Body Weight: 95 lb   % Usual Body Weight: 89%  BMI:  Body mass index is 17.27 kg/(m^2).  Estimated Nutritional  Needs: Kcal: 1300-1500 Protein: 60-75 grams Fluid: > 1.5 L/day  Skin: no issues noted  Diet Order: NPO  EDUCATION NEEDS: -No education needs identified at this time   Intake/Output Summary (Last 24 hours) at 08/01/13 0827 Last data filed at 08/01/13 0447  Gross per 24 hour  Intake      0 ml  Output   1400 ml  Net  -1400 ml    Last BM: 4/10   Labs:   Recent Labs Lab 07/31/13 1425 07/31/13 1435  NA 139 138  K 5.2 4.6  CL 96 94*  CO2 32  --   BUN 14 14  CREATININE 0.50 0.70  CALCIUM 10.5  --   GLUCOSE 310* 280*    CBG (last 3)   Recent Labs  07/31/13 1925 07/31/13 2149 08/01/13 0741  GLUCAP 237* 271* 119*   No results found for this basename: HGBA1C   Scheduled Meds: . amLODipine  10 mg Oral Daily  . aspirin  300 mg Rectal Daily   Or  . aspirin  325 mg Oral Daily  . cholecalciferol  1,000 Units Oral Daily  . glyBURIDE  5 mg Oral Q breakfast  . heparin  5,000 Units Subcutaneous 3 times per day  . ibuprofen  200 mg Oral Daily  . insulin detemir  8 Units Subcutaneous QHS  . irbesartan  300 mg Oral Daily  . metFORMIN  1,000 mg Oral BID WC  . zolpidem  5 mg Oral QHS    Continuous Infusions:  Past Medical History  Diagnosis Date  . Hypertension   . Stroke   . Diabetes mellitus without complication     History reviewed. No pertinent past surgical history.  Kendell Bane RD, LDN, CNSC [phone] Pager [phone] After Hours Pager

## 2013-08-01 NOTE — Evaluation (Signed)
Physical Therapy Evaluation Patient Details Name: Kim Chen MRN: [id number] DOB: 18-Aug-1945 Today's Date: 08/01/2013   History of Present Illness  Ms. Kim DowningBarbara Chen is a 68 y.o. female presenting with a large right brain stroke, right internal carotid artery occlusion. The patient was admitted with a left hemiparesis, time of onset is unclear, not a TPA candidate.   Clinical Impression  Pt adm due to the above. Presents with a significant decline in functional mobility secondary to deficits indicate below. Pt to benefit from skilled acute PT to address deficits and maximize mobility prior to D/C to next venue. PTA pt was very independent, working and living independently. Pt to be a great candidate for CIR to maximize independence and reduce caregiver burden upon D/C. Daughter is present and very supportive; lives in [place].     Follow Up Recommendations CIR    Equipment Recommendations  Other (comment) (TBD)    Recommendations for Other Services Rehab consult;OT consult     Precautions / Restrictions Precautions Precautions: Fall Precaution Comments: Lt side neglect; dense Lt hemiparesis  Restrictions Weight Bearing Restrictions: No      Mobility  Bed Mobility Overal bed mobility: +2 for physical assistance;Needs Assistance Bed Mobility: Supine to Sit;Sit to Supine     Supine to sit: +2 for physical assistance;Max assist     General bed mobility comments: pt able to advance Rt LE and UE but had difficulty coordinating movements and required 2 person (A) to bring LEs off EOB and elevate trunk to sitting position   Transfers Overall transfer level: Needs assistance Equipment used: 2 person hand held assist Transfers: Sit to/from Stand;Stand Pivot Transfers Sit to Stand: Max assist;+2 physical assistance Stand pivot transfers: Max assist;+2 physical assistance       General transfer comment: (A) with use of gt belt and draw pad to elevate hips and facilitate pivotal  steps to chair; pt unable to facilitate any movement with lt LE and required (A) to advance to position; was able to WB through Rt LE and used Rt UE to (A) during transfers; max cues for safety; pt attempting to push posteriorly initially but could correct with max cues   Ambulation/Gait                Stairs            Wheelchair Mobility    Modified Rankin (Stroke Patients Only) Modified Rankin (Stroke Patients Only) Pre-Morbid Rankin Score: No symptoms Modified Rankin: Severe disability     Balance Overall balance assessment: Needs assistance Sitting-balance support: Feet supported;Single extremity supported (Rt UE supported; unable to WB through Lt UE ) Sitting balance-Leahy Scale: Zero Sitting balance - Comments: pt pushing posteriorly; requires max (A) to maintain balance at EOB; tolerated sitting EOB ~5 min Postural control: Posterior lean Standing balance support: During functional activity;Bilateral upper extremity supported Standing balance-Leahy Scale: Zero Standing balance comment: max (A) of 2 for balance                              Pertinent Vitals/Pain Vitals stable t/o session    Home Living Family/patient expects to be discharged to:: Inpatient rehab Living Arrangements: Alone               Additional Comments: Pt has good family support present in room     Prior Function Level of Independence: Independent         Comments: pt was working and very  active      Hand Dominance   Dominant Hand: Right    Extremity/Trunk Assessment   Upper Extremity Assessment: Defer to OT evaluation           Lower Extremity Assessment: LLE deficits/detail   LLE Deficits / Details: pt had some non-purposeful movements in Lt LE; PROM WFL  Cervical / Trunk Assessment: Kyphotic;Other exceptions  Communication   Communication: Expressive difficulties  Cognition Arousal/Alertness: Lethargic Behavior During Therapy:  Restless;Agitated Overall Cognitive Status: Impaired/Different from baseline Area of Impairment: Orientation;Attention;Memory;Following commands;Safety/judgement;Awareness;Problem solving Orientation Level: Disoriented to;Situation;Time;Place Current Attention Level: Focused Memory: Decreased short-term memory Following Commands: Follows one step commands inconsistently Safety/Judgement: Decreased awareness of deficits;Decreased awareness of safety Awareness: Intellectual Problem Solving: Slow processing;Difficulty sequencing;Requires verbal cues;Requires tactile cues General Comments: pt repeats everything that is said; answers apprpriately to questions <50% of time; seems to be in denial; states "i did not have a stroke. do not say that word around me."     General Comments General comments (skin integrity, edema, etc.): encouraged family to sit on Lt side of pt to encourage Lt cervical rotation; unable to assess balance due to lethargy and agitation of pt     Exercises        Assessment/Plan    PT Assessment Patient needs continued PT services  PT Diagnosis Difficulty walking;Generalized weakness   PT Problem List Decreased strength;Decreased range of motion;Decreased activity tolerance;Decreased balance;Decreased mobility;Decreased cognition;Decreased coordination;Decreased knowledge of use of DME;Decreased safety awareness;Decreased knowledge of precautions;Impaired sensation  PT Treatment Interventions DME instruction;Gait training;Functional mobility training;Therapeutic activities;Therapeutic exercise;Balance training;Neuromuscular re-education;Patient/family education   PT Goals (Current goals can be found in the Care Plan section) Acute Rehab PT Goals Patient Stated Goal: " i just want to drink and eat something"  PT Goal Formulation: With patient/family Time For Goal Achievement: 08/15/13 Potential to Achieve Goals: Good    Frequency Min 4X/week   Barriers to discharge  Decreased caregiver support pt was living alone    Co-evaluation               End of Session Equipment Utilized During Treatment: Gait belt Activity Tolerance: Patient limited by lethargy;Treatment limited secondary to agitation Patient left: in chair;with call bell/phone within reach;with family/visitor present Nurse Communication: Mobility status;Need for lift equipment;Precautions         Time: 1111-1135 PT Time Calculation (min): 24 min   Charges:   PT Evaluation $Initial PT Evaluation Tier I: 1 Procedure PT Treatments $Therapeutic Activity: 8-22 mins   PT G CodesNadara Mustard [place], [place] [phone] 08/01/2013, 1:22 PM

## 2013-08-01 NOTE — Progress Notes (Signed)
Pt returned from MRI. On call Elray McgregorMary Lynch called regarding results.

## 2013-08-01 NOTE — Progress Notes (Signed)
  Echocardiogram 2D Echocardiogram has been performed.  Kim Chen 08/01/2013, 10:51 AM

## 2013-08-01 NOTE — Evaluation (Signed)
Speech Language Pathology Evaluation Patient Details Name: Beckey DowningBarbara Yanko MRN: [id number] DOB: Oct 26, 1945 Today's Date: 08/01/2013 Time: [id number] SLP Time Calculation (min): 15 min  Problem List:  Patient Active Problem List   Diagnosis Date Noted  . Protein-calorie malnutrition, severe 08/01/2013  . Acute embolic stroke 07/31/2013  . Hypertension 07/31/2013  . Diabetes 07/31/2013   Past Medical History:  Past Medical History  Diagnosis Date  . Hypertension   . Stroke   . Diabetes mellitus without complication    Past Surgical History: History reviewed. No pertinent past surgical history. HPI:  Beckey DowningBarbara Withington is an 68 y.o. 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  CT unremarkable.  MRI pending.  Patient independent with all ADL's prior to recent hospitalization.    Assessment / Plan / Recommendation Clinical Impression  Cognitive Linguistic Evaluation completed per Stroke Protocol.  Receptive and expressive language skills baseline.  Moderate to severe cognitive deficits present in areas of problem solving, intellectual awareness of physicial deficits, sustained attention, and reasoning.   Recommend continued Skilled ST in acute care setting to address deficits.  [facility].      SLP Assessment  Patient needs continued Speech Lanaguage Pathology Services    Follow Up Recommendations  Inpatient Rehab    Frequency and Duration min 3x week  2 weeks      SLP Goals  SLP Goals Potential to Achieve Goals: Fair Potential Considerations: Severity of impairments  SLP Evaluation Prior Functioning  Cognitive/Linguistic Baseline: Within functional limits Type of Home: House  Lives With: Alone Available Help at Discharge: Family;Available PRN/intermittently Vocation: Part time employment   Cognition  Overall Cognitive Status: Impaired/Different from baseline Arousal/Alertness: Lethargic Orientation Level: Oriented to person Attention: Focused;Sustained Focused Attention: Impaired Focused Attention Impairment: Verbal basic;Functional basic Sustained Attention: Impaired Sustained Attention Impairment: Verbal basic;Functional basic Awareness: Impaired Awareness Impairment: Intellectual impairment;Emergent impairment;Anticipatory impairment Problem Solving: Impaired Problem Solving Impairment: Verbal basic;Functional basic Behaviors: Poor frustration tolerance Safety/Judgment: Impaired    Comprehension  Auditory Comprehension Overall Auditory Comprehension: Appears within functional limits for tasks assessed Visual Recognition/Discrimination Discrimination: Not tested Reading Comprehension Reading Status: Not tested    Expression Expression Primary Mode of Expression: Verbal Verbal Expression Overall Verbal Expression: Appears within functional limits for tasks assessed Written Expression Dominant Hand: Right Written Expression: Not tested   Oral / Motor Oral Motor/Sensory Function Overall Oral Motor/Sensory Function: Impaired Labial ROM: Reduced left Labial Symmetry: Abnormal symmetry left Labial Strength: Reduced Labial Sensation: Reduced Lingual ROM: Reduced left Lingual Symmetry: Abnormal symmetry left Lingual Strength: Reduced Lingual Sensation: Reduced Facial ROM: Reduced left Facial Symmetry: Left droop Facial Strength: Reduced Facial Sensation: Reduced Velum: Within Functional Limits Mandible: Within Functional Limits Motor Speech Overall Motor Speech: Appears within functional limits for tasks assessed   GO    Moreen FowlerKaren Brandilynn Taormina MS, CCC-SLP [phone] Lacinda AxonKaren H Victorhugo Preis 08/01/2013, 6:02 PM

## 2013-08-01 NOTE — Progress Notes (Signed)
Stroke Team Progress Note  HISTORY  Kim Chen is an 68 y.o. 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  She was initially brought by ambulance to [facility] due to acute onset confusion with right sided weakness, slurred speech, and right gaze preference.  Conflicting information about when she was last known well, but one of her family members is at the bedside and told me that she was fine and able to work on Wednesday. However, review of her clinical record indicated that she was seen in the ED on 3/18 with similar complaints and altered mental status while driving around in the [place] parking lot.  In any case, patient was apparently talking to his sister approximately 1 PM today when her sister noted patient had slurred speech. EMS was notified and they had to break in the house. She was found on the floor but conscious.  A code stroke was activated at [facility]. CT brain showed no acute abnormality. She improved while in he ED and after discussion with tele neurology she was deemed not a candidate for thrombolysis.  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  Presently, complains of feeling hungry and having neck soreness but denies HA, vertigo, double vision, visual disturbances, chest pain, or palpitations.  Date last known well: uncertain  Time last known well: uncertain  tPA Given: no, unclear time of onset    SUBJECTIVE No family is at bedside. The patient is alert, complaining of headache, is thirsty wants to drink.  OBJECTIVE Most recent Vital Signs: Filed Vitals:   08/01/13 0344 08/01/13 0616 08/01/13 0737 08/01/13 0740  BP: 169/70 132/83   171/87  Pulse: 78 90  82  Temp: 97.9 F (36.6 C)  97.8 F (36.6 C)   TempSrc: Oral  Oral   Resp: 15 16  19   Height:      Weight:      SpO2: 95% 95%  94%   CBG (last 3)   Recent Labs  07/31/13 1925 07/31/13 2149 08/01/13 0741  GLUCAP 237* 271* 119*    IV Fluid Intake:     MEDICATIONS  . amLODipine  10 mg Oral Daily  . aspirin  300 mg Rectal Daily   Or  . aspirin  325 mg Oral Daily  . cholecalciferol  1,000 Units Oral Daily  . glyBURIDE  5 mg Oral Q breakfast  . heparin  5,000 Units Subcutaneous 3 times per day  . ibuprofen  200 mg Oral Daily  . insulin detemir  8 Units Subcutaneous QHS  . irbesartan  300 mg Oral Daily  . metFORMIN  1,000 mg Oral BID WC  . zolpidem  5 mg Oral QHS   PRN:  acetaminophen, acetaminophen, loperamide, senna-docusate  Diet:  NPO  Activity:  Up with assistance DVT Prophylaxis:  SQ heparin  CLINICALLY SIGNIFICANT STUDIES Basic Metabolic Panel:  Recent Labs Lab 07/31/13 1425 07/31/13 1435  NA 139 138  K 5.2 4.6  CL 96 94*  CO2 32  --   GLUCOSE 310* 280*  BUN 14 14  CREATININE 0.50 0.70  CALCIUM 10.5  --    Liver Function Tests:  Recent Labs Lab 07/31/13 1425  AST 21  ALT 18  ALKPHOS  105  BILITOT 0.5  PROT 7.5  ALBUMIN 4.2   CBC:  Recent Labs Lab 07/31/13 1425 07/31/13 1435  WBC 13.2*  --   NEUTROABS 10.5*  --   HGB 16.7* 17.7*  HCT 47.1* 52.0*  MCV 88.4  --   PLT 244  --    Coagulation:  Recent Labs Lab 07/31/13 1425  LABPROT 13.1  INR 1.01   Cardiac Enzymes: No results found for this basename: CKTOTAL, CKMB, CKMBINDEX, TROPONINI,  in the last 168 hours Urinalysis:  Recent Labs Lab 07/31/13 1714  COLORURINE YELLOW  LABSPEC 1.010  PHURINE 7.0  GLUCOSEU >1000*  HGBUR NEGATIVE  BILIRUBINUR NEGATIVE  KETONESUR NEGATIVE  PROTEINUR NEGATIVE  UROBILINOGEN 0.2  NITRITE NEGATIVE  LEUKOCYTESUR NEGATIVE   Lipid Panel    Component Value Date/Time   CHOL 171 08/01/2013 0438   TRIG 131 08/01/2013 0438    HDL 57 08/01/2013 0438   CHOLHDL 3.0 08/01/2013 0438   VLDL 26 08/01/2013 0438   LDLCALC 88 08/01/2013 0438   HgbA1C  No results found for this basename: HGBA1C    Urine Drug Screen:     Component Value Date/Time   LABOPIA NONE DETECTED 07/31/2013 1714   COCAINSCRNUR NONE DETECTED 07/31/2013 1714   LABBENZ NONE DETECTED 07/31/2013 1714   AMPHETMU NONE DETECTED 07/31/2013 1714   THCU NONE DETECTED 07/31/2013 1714   LABBARB NONE DETECTED 07/31/2013 1714    Alcohol Level:  Recent Labs Lab 07/31/13 1432  ETH <11    Ct Angio Head W/cm &/or Wo Cm  07/31/2013   CLINICAL DATA:  Code stroke, evaluate.  EXAM: CT ANGIOGRAPHY HEAD  TECHNIQUE: Multidetector CT imaging of the head was performed using the standard protocol during bolus administration of intravenous contrast. Multiplanar CT image reconstructions and MIPs were obtained to evaluate the vascular anatomy.  CONTRAST:  80mL OMNIPAQUE IOHEXOL 350 MG/ML SOLN  COMPARISON:  CT HEAD W/O CM dated 07/31/2013  FINDINGS: Anterior circulation: Complete loss of contrast opacification of the included right cervical, petrous internal carotid artery. Mild hypo dense contrast opacification of the right distal cavernous internal carotid artery, with apparent high-grade stenosis of the anterior genu of the right internal carotid artery, axial 35/79. Robust right carotid terminus contrast opacification, with tiny posterior communicating artery and diminutive but patent left A1 segment. Robust left posterior communicating artery is present. Supernumerary anterior cerebral artery with patent proximal anterior and middle cerebral arteries, however there is paucity of distal right middle cerebral artery and anterior cerebral artery vessels, which may slow flow.  Posterior circulation: Right vertebral artery is dominant, the left vertebral artery predominantly terminates in left posterior-inferior cerebellar artery. Patent basilar artery and main branch vessels. Normal  appearance of the posterior cerebral arteries.  No aneurysm, no suspicious luminal irregularity.  Slight subcentimeter wedge-like enhancement of the right occipital (axial 20/32, series 7) and right inferior frontal lobe (axial 19/32, series 7), of unclear clinical significance.  Review of the MIP images confirms the above findings.  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 This could be further characterized with dedicated CT angiogram of the neck.  Apparent high-grade stenosis of the anterior genu of the right internal carotid artery.  Relative paucity of distal anterior and middle cerebral artery vessels, favored to reflect delayed flow due to the proximal ICA occlusion.  Very slight enhancement of the right inferior frontal lobe and right occipital lung may reflect partial voluming of cortical veins or possibly subacute ischemia,  and would be better characterized on MRI of the brain with contrast as clinically indicated.  Acute findings discussed with and reconfirmed by [facility] COOK on4/10/2015at4:55 PM.   Electronically Signed   By: Awilda Metro   On: 07/31/2013 17:01   Dg Chest 2 View  08/01/2013   CLINICAL DATA:  Stroke.  EXAM: CHEST  2 VIEW  COMPARISON:  07/08/2013  FINDINGS: The heart size and mediastinal contours are within normal limits. Both lungs are clear. The visualized skeletal structures are unremarkable.  IMPRESSION: No active cardiopulmonary disease.   Electronically Signed   By: Burman Nieves M.D.   On: 08/01/2013 01:55   Ct Head Wo Contrast  07/31/2013   CLINICAL DATA:  Code stroke, sudden weakness, slurred speech  EXAM: CT HEAD WITHOUT CONTRAST  TECHNIQUE: Contiguous axial images were obtained from the base of the skull through the vertex without intravenous contrast.  COMPARISON:  07/08/2013  FINDINGS: Mild generalized atrophy.  Normal ventricular morphology.  No midline shift or mass effect.   Scattered small vessel chronic ischemic changes of deep cerebral white matter, most focal in right parietal region, stable.  No intracranial hemorrhage, mass lesion or evidence acute infarction.  No extra-axial fluid collections.  Atherosclerotic calcification at the carotid siphons bilaterally.  Nasal septal deviation to the left.  No acute bone or sinus abnormalities.  IMPRESSION: Small vessel chronic ischemic changes of deep cerebral white matter.  No acute intracranial abnormalities.  Critical Value/emergent results were called by telephone at the time of interpretation on 07/31/2013 at 1423 hr to Dr. Donnetta Hutching , who verbally acknowledged these results.   Electronically Signed   By: Ulyses Southward M.D.   On: 07/31/2013 14:31    CT of the brain   IMPRESSION:  Small vessel chronic ischemic changes of deep cerebral white matter.  No acute intracranial abnormalities.    CTA Head  IMPRESSION:  Right internal carotid artery occlusion with retrograde flow via  posterior and anterior communicating arteries. This could be further  characterized with dedicated CT angiogram of the neck.  Apparent high-grade stenosis of the anterior genu of the right  internal carotid artery.  Relative paucity of distal anterior and middle cerebral artery  vessels, favored to reflect delayed flow due to the proximal ICA  occlusion.  Very slight enhancement of the right inferior frontal lobe and right  occipital lung may reflect partial voluming of cortical veins or  possibly subacute ischemia, and would be better characterized on MRI  of the brain with contrast as clinically indicated.  MRI of the brain    MRA of the brain    2D Echocardiogram    Carotid Doppler    CXR   IMPRESSION:  No active cardiopulmonary disease.  EKG   Normal sinus rhythm Cannot rule out Anterior infarct , age undetermined Abnormal ECG When compared with ECG of 08-Jul-2013 12:18, ST no longer depressed in Inferior leads T wave  inversion no longer evident in Inferior leads  Therapy Recommendations pending  Physical Exam  General: The patient is alert and cooperative at the time of the examination.  Skin: No significant peripheral edema is noted.   Neurologic Exam  Mental status: The patient is oriented x 3.  Cranial nerves: Facial symmetry is not present. There is depression of the left nasolabial fold. Speech is normal, no aphasia or dysarthria is noted. Extraocular movements are restricted, unable to initiate conjugate gaze to the left. The patient has a dense left homonymous visual field deficit.  Motor: The patient has good strength in the right extremities. On the left side, the left arm is near plegic, the patient has minimal movement of the left leg.  Sensory examination: Decreased soft touch sensation on the left leg, the patient is unable to feel the left arm. The patient has neglect of the left arm.  Coordination: The patient has good finger-nose-finger with the right arm. The patient will not use the right leg well with cerebellar testing, unable to perform on the left side to  Gait and station: The patient was not ambulated.  Reflexes: Deep tendon reflexes are symmetric.    ASSESSMENT Ms. Kim Chen is a 68 y.o. female presenting with a large right brain stroke, right internal carotid artery occlusion. The patient was admitted with a left hemiparesis, time of onset is unclear, not a TPA candidate. The patient is being treated with aspirin at this time, not clear that she was on aspirin prior to admission.   Diabetes  Right brain stroke, right internal carotid artery occlusion  Hypertension  Hospital day # 1  The patient lives alone at home, [place]. The patient has sustained a large right brain stroke, will likely not return to independent living.  TREATMENT/PLAN  Aspirin therapy  MRI brain  MRA head  2-D echocardiogram  Carotid  Doppler study  Physical, occupational, and speech therapy evaluation  Will require case manager for discharge planning  Stroke team will follow  York SpanielCharles K Willis  08/01/2013 9:09 AM

## 2013-08-01 NOTE — Progress Notes (Signed)
[facility] PROGRESS NOTE  Kim Chen ZOX:[id number] DOB: [birth year] DOA: 07/31/2013 PCP: Isabella Stalling, MD  Assessment/Plan: CVA -MRI/MRA pending -SLP swallow study pending -Obtain echocardiogram  -Obtain carotid Doppler study    HTN -Patient most likely with an acute CVA Will allow permissive HTN  Diabetes -NPO -Obtain hemoglobin A1c -Control with Levemir 8 units + moderate SSI  HLD -Obtain lipid panel  Nicotine abuse -No nicotine products considering patient's current condition     Code Status: Full Family Communication: Family at bedside Disposition Plan: Resolution CVA   Consultants: Dr. Stephanie Acre (neurology)      Procedures: Echocardiogram 08/01/2013 -Left ventricle: The cavity size was normal. Mild focal basal hypertrophy of the septum.  -LVEF= 60% to 65%.  -(grade 1 diastolic dysfunction). - Mitral valve: Calcified annulus. Mildly thickened leaflets - Left atrium: The atrium was mildly dilated.    Antibiotics:  HPI/Subjective: Hazelgrace Bonham is a 68 y.o.WF PMHx diabetes type 2, HTN, nicotine abuse, Hx  stroke presented to the ED with increased confusion weakness and facial drooling. Patient now seems to be improving. She staets that she appeared to have gotten weak and fell back into her chair. She at the time did not have a headache although she now states that she has one. She states that she had no loss of consciousness. She has had no seizure activity. Apparently she was in the ED on 3/18 with similar complaints and altered mental status while driving around in the [place] parking lot (per family LOC and refused to allow physicians at any pain to workup problem). She does have a history of high blood pressure and is taking her medications. At the time she was seen she is awake oriented to person place and location. She at times was noted to have some confusion however. 4/11 per family patient also had recent motor 1 PPD x60 years  stopped December 2014. Only partially compliant with medication   Objective: Filed Vitals:   08/01/13 1130 08/01/13 1500 08/01/13 1530 08/01/13 2050  BP: 172/76  174/83 167/84  Pulse: 95  102 107  Temp: 98.1 F (36.7 C) 98 F (36.7 C)    TempSrc: Oral Oral    Resp: 20  22 19   Height:      Weight:      SpO2: 91%  100% 96%    Intake/Output Summary (Last 24 hours) at 08/01/13 2226 Last data filed at 08/01/13 1600  Gross per 24 hour  Intake    100 ml  Output   1025 ml  Net   -925 ml   Filed Weights   07/31/13 1444 07/31/13 2129  Weight: 40.824 kg (90 lb) 38.8 kg (85 lb 8.6 oz)    Exam:   General: A./O., able to follow commands and answer simple yes and no questions  Cardiovascular: Regular rhythm and rate, negative murmurs rubs gallops  Respiratory: Diffuse coarse/rhonchi  Abdomen: Soft, nontender, nondistended, plus bowel sound  Musculoskeletal: Negative pedal edema  Neurologic cranial nerves II through XII intact, anisocoria (left pupil larger and sluggishly react to light), left hemiparesis including face, right upper to extremity/lower extremity strength 5/5, sensation intact throughout, upgoing Babinski on the left.    Data Reviewed: Basic Metabolic Panel:  Recent Labs Lab 07/31/13 1425 07/31/13 1435  NA 139 138  K 5.2 4.6  CL 96 94*  CO2 32  --   GLUCOSE 310* 280*  BUN 14 14  CREATININE 0.50 0.70  CALCIUM 10.5  --    Liver Function  Tests:  Recent Labs Lab 07/31/13 1425  AST 21  ALT 18  ALKPHOS 105  BILITOT 0.5  PROT 7.5  ALBUMIN 4.2   No results found for this basename: LIPASE, AMYLASE,  in the last 168 hours No results found for this basename: AMMONIA,  in the last 168 hours CBC:  Recent Labs Lab 07/31/13 1425 07/31/13 1435  WBC 13.2*  --   NEUTROABS 10.5*  --   HGB 16.7* 17.7*  HCT 47.1* 52.0*  MCV 88.4  --   PLT 244  --    Cardiac Enzymes: No results found for this basename: CKTOTAL, CKMB, CKMBINDEX, TROPONINI,  in the  last 168 hours BNP (last 3 results) No results found for this basename: PROBNP,  in the last 8760 hours CBG:  Recent Labs Lab 07/31/13 2149 08/01/13 0741 08/01/13 1159 08/01/13 1545 08/01/13 2151  GLUCAP 271* 119* 161* 219* 206*    Recent Results (from the past 240 hour(s))  MRSA PCR SCREENING     Status: None   Collection Time    08/01/13  1:32 AM      Result Value Ref Range Status   MRSA by PCR NEGATIVE  NEGATIVE Final   Comment:            The GeneXpert MRSA Assay (FDA     approved for NASAL specimens     only), is one component of a     comprehensive MRSA colonization     surveillance program. It is not     intended to diagnose MRSA     infection nor to guide or     monitor treatment for     MRSA infections.     Studies: Ct Angio Head W/cm &/or Wo Cm  07/31/2013   CLINICAL DATA:  Code stroke, evaluate.  EXAM: CT ANGIOGRAPHY HEAD  TECHNIQUE: Multidetector CT imaging of the head was performed using the standard protocol during bolus administration of intravenous contrast. Multiplanar CT image reconstructions and MIPs were obtained to evaluate the vascular anatomy.  CONTRAST:  80mL OMNIPAQUE IOHEXOL 350 MG/ML SOLN  COMPARISON:  CT HEAD W/O CM dated 07/31/2013  FINDINGS: Anterior circulation: Complete loss of contrast opacification of the included right cervical, petrous internal carotid artery. Mild hypo dense contrast opacification of the right distal cavernous internal carotid artery, with apparent high-grade stenosis of the anterior genu of the right internal carotid artery, axial 35/79. Robust right carotid terminus contrast opacification, with tiny posterior communicating artery and diminutive but patent left A1 segment. Robust left posterior communicating artery is present. Supernumerary anterior cerebral artery with patent proximal anterior and middle cerebral arteries, however there is paucity of distal right middle cerebral artery and anterior cerebral artery vessels,  which may slow flow.  Posterior circulation: Right vertebral artery is dominant, the left vertebral artery predominantly terminates in left posterior-inferior cerebellar artery. Patent basilar artery and main branch vessels. Normal appearance of the posterior cerebral arteries.  No aneurysm, no suspicious luminal irregularity.  Slight subcentimeter wedge-like enhancement of the right occipital (axial 20/32, series 7) and right inferior frontal lobe (axial 19/32, series 7), of unclear clinical significance.  Review of the MIP images confirms the above findings.  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 This could be further characterized with dedicated CT angiogram of the neck.  Apparent high-grade stenosis of the anterior genu of the right internal carotid artery.  Relative paucity of distal anterior and middle cerebral artery vessels, favored  to reflect delayed flow due to the proximal ICA occlusion.  Very slight enhancement of the right inferior frontal lobe and right occipital lung may reflect partial voluming of cortical veins or possibly subacute ischemia, and would be better characterized on MRI of the brain with contrast as clinically indicated.  Acute findings discussed with and reconfirmed by [facility]Dr.BRIAN COOK on4/10/2015at4:55 PM.   Electronically Signed   By: Awilda Metroourtnay  Bloomer   On: 07/31/2013 17:01   Dg Chest 2 View  08/01/2013   CLINICAL DATA:  Stroke.  EXAM: CHEST  2 VIEW  COMPARISON:  07/08/2013  FINDINGS: The heart size and mediastinal contours are within normal limits. Both lungs are clear. The visualized skeletal structures are unremarkable.  IMPRESSION: No active cardiopulmonary disease.   Electronically Signed   By: Burman NievesWilliam  Stevens M.D.   On: 08/01/2013 01:55   Ct Head Wo Contrast  07/31/2013   CLINICAL DATA:  Code stroke, sudden weakness, slurred speech  EXAM: CT HEAD WITHOUT CONTRAST  TECHNIQUE: Contiguous  axial images were obtained from the base of the skull through the vertex without intravenous contrast.  COMPARISON:  07/08/2013  FINDINGS: Mild generalized atrophy.  Normal ventricular morphology.  No midline shift or mass effect.  Scattered small vessel chronic ischemic changes of deep cerebral white matter, most focal in right parietal region, stable.  No intracranial hemorrhage, mass lesion or evidence acute infarction.  No extra-axial fluid collections.  Atherosclerotic calcification at the carotid siphons bilaterally.  Nasal septal deviation to the left.  No acute bone or sinus abnormalities.  IMPRESSION: Small vessel chronic ischemic changes of deep cerebral white matter.  No acute intracranial abnormalities.  Critical Value/emergent results were called by telephone at the time of interpretation on 07/31/2013 at 1423 hr to Dr. Donnetta HutchingBRIAN COOK , who verbally acknowledged these results.   Electronically Signed   By: Ulyses SouthwardMark  Boles M.D.   On: 07/31/2013 14:31   Mr Brain Wo Contrast  08/01/2013   CLINICAL DATA:  Hypertension.  Diabetes.  Weakness.  Slurred speech.  EXAM: MRI HEAD WITHOUT CONTRAST  MRA HEAD WITHOUT CONTRAST  TECHNIQUE: Multiplanar, multiecho pulse sequences of the brain and surrounding structures were obtained without intravenous contrast. Angiographic images of the head were obtained using MRA technique without contrast.  COMPARISON:  CT angiogram and CT head 07/31/2013.  FINDINGS: MRI HEAD FINDINGS  Exam is motion degraded.  Large right hemispheric acute infarct involving majority of the right frontal lobe and portions of the right parietal lobe, temporal lobe, basal ganglia, subinsular region and peri operculum region. Mild gyriform hemorrhage right frontal lobe. Mass effect upon the right lateral ventricle without midline shift.  Small infarcts left corpus callosum and cingulate gyrus.  Blood breakdown products anterior left lenticular nucleus and left caudate head of indeterminate age.  Remote  infarct posterior left lenticular nucleus.  Mild small vessel disease type changes.  No hydrocephalus.  No intracranial mass lesion noted on this unenhanced motion degraded exam.  MRA HEAD FINDINGS  Exam is motion degraded.  Right internal carotid artery and left vertebral artery appear to be occluded.  No well defined collateral flow.  Nonvisualized left anterior cerebral artery. Narrowing left middle cerebral artery bifurcation and branch vessels.  Irregularity with mild to slightly moderate tandem stenosis distal right vertebral artery and proximal basilar artery.  Poor delineation majority of the posterior circulation branch vessels.  Limited for evaluating for presence of an aneurysm given the motion.  IMPRESSION: MRI HEAD :  Exam is motion degraded.  Large right hemispheric acute infarct involving majority of the right frontal lobe and portions of the right parietal lobe, temporal lobe, basal ganglia, subinsular region and peri operculum region. Mild gyriform hemorrhage right frontal lobe. Mass effect upon the right lateral ventricle without midline shift.  Small infarcts left corpus callosum and cingulate gyrus.  Blood breakdown products anterior left lenticular nucleus and left caudate head of indeterminate age.  Remote infarct posterior left lenticular nucleus.  Mild small vessel disease type changes.  MRA HEAD FINDINGS  Exam is motion degraded.  Right internal carotid artery and left vertebral artery appear to be occluded.  These results were called by telephone at the time of interpretation on 08/01/2013 at 8:47 PM to Estill Bakes nurse who verbally acknowledged these results.   Electronically Signed   By: Bridgett Larsson M.D.   On: 08/01/2013 20:47   Mr Maxine Glenn Head/brain Wo Cm  08/01/2013   CLINICAL DATA:  Hypertension.  Diabetes.  Weakness.  Slurred speech.  EXAM: MRI HEAD WITHOUT CONTRAST  MRA HEAD WITHOUT CONTRAST  TECHNIQUE: Multiplanar, multiecho pulse sequences of the brain and surrounding  structures were obtained without intravenous contrast. Angiographic images of the head were obtained using MRA technique without contrast.  COMPARISON:  CT angiogram and CT head 07/31/2013.  FINDINGS: MRI HEAD FINDINGS  Exam is motion degraded.  Large right hemispheric acute infarct involving majority of the right frontal lobe and portions of the right parietal lobe, temporal lobe, basal ganglia, subinsular region and peri operculum region. Mild gyriform hemorrhage right frontal lobe. Mass effect upon the right lateral ventricle without midline shift.  Small infarcts left corpus callosum and cingulate gyrus.  Blood breakdown products anterior left lenticular nucleus and left caudate head of indeterminate age.  Remote infarct posterior left lenticular nucleus.  Mild small vessel disease type changes.  No hydrocephalus.  No intracranial mass lesion noted on this unenhanced motion degraded exam.  MRA HEAD FINDINGS  Exam is motion degraded.  Right internal carotid artery and left vertebral artery appear to be occluded.  No well defined collateral flow.  Nonvisualized left anterior cerebral artery. Narrowing left middle cerebral artery bifurcation and branch vessels.  Irregularity with mild to slightly moderate tandem stenosis distal right vertebral artery and proximal basilar artery.  Poor delineation majority of the posterior circulation branch vessels.  Limited for evaluating for presence of an aneurysm given the motion.  IMPRESSION: MRI HEAD :  Exam is motion degraded.  Large right hemispheric acute infarct involving majority of the right frontal lobe and portions of the right parietal lobe, temporal lobe, basal ganglia, subinsular region and peri operculum region. Mild gyriform hemorrhage right frontal lobe. Mass effect upon the right lateral ventricle without midline shift.  Small infarcts left corpus callosum and cingulate gyrus.  Blood breakdown products anterior left lenticular nucleus and left caudate head of  indeterminate age.  Remote infarct posterior left lenticular nucleus.  Mild small vessel disease type changes.  MRA HEAD FINDINGS  Exam is motion degraded.  Right internal carotid artery and left vertebral artery appear to be occluded.  These results were called by telephone at the time of interpretation on 08/01/2013 at 8:47 PM to Estill Bakes nurse who verbally acknowledged these results.   Electronically Signed   By: Bridgett Larsson M.D.   On: 08/01/2013 20:47    Scheduled Meds: . amLODipine  10 mg Oral Daily  . aspirin  300 mg Rectal Daily   Or  . aspirin  325 mg Oral Daily  .  cholecalciferol  1,000 Units Oral Daily  . glyBURIDE  5 mg Oral Q breakfast  . heparin  5,000 Units Subcutaneous 3 times per day  . ibuprofen  200 mg Oral Daily  . insulin detemir  8 Units Subcutaneous QHS  . irbesartan  300 mg Oral Daily  . metFORMIN  1,000 mg Oral BID WC  . zolpidem  5 mg Oral QHS   Continuous Infusions:   Principal Problem:   Acute embolic stroke Active Problems:   Hypertension   Diabetes   Protein-calorie malnutrition, severe   HLD (hyperlipidemia)   Nicotine abuse    Time spent: 60 min    Drema Dallas  [facility] Pager [phone]. If 7PM-7AM, please contact night-coverage at [email or web address], password [facility] 08/01/2013, 10:26 PM  LOS: 1 day

## 2013-08-01 NOTE — Evaluation (Addendum)
Clinical/Bedside Swallow Evaluation Patient Details  Name: Kim DowningBarbara Schwebke MRN: [id number] Date of Birth: November 27, 1945  Today's Date: 08/01/2013 Time: 1630-1700 SLP Time Calculation (min): 30 min  Past Medical History:  Past Medical History  Diagnosis Date  . Hypertension   . Stroke   . Diabetes mellitus without complication    Past Surgical History: History reviewed. No pertinent past surgical history. HPI:  Kim DowningBarbara Raimondo is an 68 y.o. female, right handed, with a past medical history significant for HTN, DM, right cerebral infarct with residual left HP, [facility] further evaluation of the above stated symptoms.  She was initially brought by ambulance to [facility] due to acute onset confusion with left sided weakness, slurred speech, and right gaze preference. A code stroke was activated at [facility]. CT showed no acute abnormality.  MRI pending.  BSE ordered per Stroke Protocol.      Assessment / Plan / Recommendation Clinical Impression  BSE completed.  Moderate to severe sensory motor oral dysphagia with suspected pharyngeal dysphagia.    Immediate +  s/s of aspiration s/p swallow of teaspoons of thin water.  No outward clinical s/s of aspiration noted with PO trials of puree consistency but swallows elicited were delayed with reduced hyoid laryngeal elevation.  Elevation decreased with swallows in succession decreasing adequate airway protection.   Due to s/s present  recommend to continue NPO status.  ST to f/u on 08/02/13 bedside to reassess swallow to determine PO readiness vs. Readiness to complete objective evaluation.       Aspiration Risk  Severe    Diet Recommendation NPO   Medication Administration: Via alternative means    Other  Recommendations Oral Care Recommendations: Oral care Q4 per protocol   Follow Up Recommendations  Inpatient Rehab    Frequency and Duration min 3x week  2 weeks       SLP Swallow Goals Please refer to Care Plans for listed goals.    Swallow Study Prior Functional Status   Lived at home.  No prior history of dysphagia     General Date of Onset: 07/31/13 HPI:  Kim DowningBarbara Hinde is a 68 y.o. female with prior history of stroke presented to the ED with increased confusion and  weakness. Type of Study: Bedside swallow evaluation Diet Prior to this Study: NPO Temperature Spikes Noted: No Respiratory Status: Room air History of Recent Intubation: No Behavior/Cognition: Confused;Lethargic;Distractible;Requires cueing Oral Cavity - Dentition: Adequate natural dentition Self-Feeding Abilities: Total assist Patient Positioning: Upright in chair Baseline Vocal Quality: Hoarse Volitional Cough: Wet;Weak Volitional Swallow: Unable to elicit    Oral/Motor/Sensory Function Overall Oral Motor/Sensory Function: Impaired Labial ROM: Reduced left Labial Symmetry: Abnormal symmetry left Labial Strength: Reduced Labial Sensation: Reduced Lingual ROM: Reduced left Lingual Symmetry: Abnormal symmetry left Lingual Strength: Reduced Lingual Sensation: Reduced Facial ROM: Reduced left Facial Symmetry: Left droop Facial Strength: Reduced Facial Sensation: Reduced Velum: Within Functional Limits Mandible: Within Functional Limits   Ice Chips Ice chips: Not tested   Thin Liquid Thin Liquid: Impaired Presentation: Spoon Pharyngeal  Phase Impairments: Suspected delayed Swallow;Decreased hyoid-laryngeal movement;Cough - Immediate    Nectar Thick Nectar Thick Liquid: Not tested   Honey Thick Honey Thick Liquid: Not tested   Puree Puree: Impaired Oral Phase Functional Implications: Oral residue Pharyngeal Phase Impairments: Suspected delayed Swallow;Decreased hyoid-laryngeal movement;Cough - Delayed   Solid   GO    Solid: Not tested      Moreen FowlerKaren Karris Deangelo MS, CCC-SLP [phone] Lacinda AxonKaren H Zayan Delvecchio 08/01/2013,5:26 PM

## 2013-08-02 DIAGNOSIS — Z515 Encounter for palliative care: Secondary | ICD-10-CM

## 2013-08-02 DIAGNOSIS — E43 Unspecified severe protein-calorie malnutrition: Secondary | ICD-10-CM

## 2013-08-02 LAB — GLUCOSE, CAPILLARY
Glucose-Capillary: 104 mg/dL — ABNORMAL HIGH (ref 70–99)
Glucose-Capillary: 142 mg/dL — ABNORMAL HIGH (ref 70–99)
Glucose-Capillary: 164 mg/dL — ABNORMAL HIGH (ref 70–99)
Glucose-Capillary: 238 mg/dL — ABNORMAL HIGH (ref 70–99)

## 2013-08-02 LAB — LIPID PANEL
CHOL/HDL RATIO: 3 ratio
Cholesterol: 181 mg/dL (ref 0–200)
HDL: 61 mg/dL (ref >39)
LDL Cholesterol: 97 mg/dL (ref 0–99)
TRIGLYCERIDES: 113 mg/dL (ref <150)
VLDL: 23 mg/dL (ref 0–40)

## 2013-08-02 LAB — HEMOGLOBIN A1C
HEMOGLOBIN A1C: 12.2 % — AB (ref <5.7)
MEAN PLASMA GLUCOSE: 303 mg/dL — AB (ref <117)

## 2013-08-02 MED ORDER — LIVING WELL WITH DIABETES BOOK
Freq: Once | Status: DC
  Filled 2013-08-02: qty 1

## 2013-08-02 MED ORDER — DIAZEPAM 5 MG/ML IJ SOLN
5.0000 mg | Freq: Two times a day (BID) | INTRAMUSCULAR | Status: DC
  Administered 2013-08-02 – 2013-08-03 (×3): 5 mg via INTRAVENOUS
  Filled 2013-08-02 (×3): qty 2

## 2013-08-02 MED ORDER — MORPHINE SULFATE 2 MG/ML IJ SOLN
1.0000 mg | INTRAMUSCULAR | Status: DC | PRN
  Administered 2013-08-02: 2 mg via INTRAVENOUS
  Filled 2013-08-02: qty 1

## 2013-08-02 MED ORDER — MORPHINE BOLUS VIA INFUSION
2.0000 mg | INTRAVENOUS | Status: DC | PRN
  Administered 2013-08-02: 2 mg via INTRAVENOUS
  Filled 2013-08-02: qty 2

## 2013-08-02 MED ORDER — LORAZEPAM 2 MG/ML IJ SOLN
1.0000 mg | INTRAMUSCULAR | Status: DC | PRN
  Administered 2013-08-02: 1 mg via INTRAVENOUS

## 2013-08-02 MED ORDER — SODIUM CHLORIDE 0.9 % IV SOLN
1.0000 mg/h | INTRAVENOUS | Status: DC
  Administered 2013-08-02: 1 mg/h via INTRAVENOUS
  Administered 2013-08-03: 3 mg/h via INTRAVENOUS
  Filled 2013-08-02 (×2): qty 10

## 2013-08-02 NOTE — Progress Notes (Signed)
Paged Dr. Anne HahnWillis at [phone] and [phone] to clarify code status in terms of intubation-- no response to either page. Further discussed intubation scenarios with pt's daughter (and husband) and they said they'd discuss them with each other. Dr. Phillips OdorGolding at the bedside at 1140. Kim Chen

## 2013-08-02 NOTE — Progress Notes (Signed)
SLP Cancellation Note  Patient Details Name: Kim DowningBarbara Chen MRN: [id number] DOB: 06-Aug-1945   Cancelled treatment:  ST f/u to reassess swallow to determine PO readiness.  Treatment not completed due to current medical status.  ST to sign off.   Moreen FowlerKaren Jadarrius Maselli MS, CCC-SLP [phone] Lacinda AxonKaren H [place]hower 08/02/2013, 1:08 PM

## 2013-08-02 NOTE — Progress Notes (Signed)
[facility] PROGRESS NOTE  Kim Chen ZOX:[id number] DOB: 1945/04/24 DOA: 07/31/2013 PCP: Isabella Stalling, MD  Assessment/Plan: CVA -Comfort care, per palliative care  HTN -Comfort care, per palliative care  Diabetes -Comfort care, per palliative care  HLD Comfort care  Nicotine abuse -No nicotine products considering patient's current condition     Code Status: Comfort care  Family Communication: Family at bedside Disposition Plan: Resolution CVA   Consultants: Dr. Stephanie Acre (neurology) Dr. Julaine Fusi (palliative care)      Procedures: MRI/MRA 08/02/2013 MRI HEAD :  Exam is motion degraded.  Large right hemispheric acute infarct involving majority of the  right frontal lobe and portions of the right parietal lobe, temporal  lobe, basal ganglia, subinsular region and peri operculum region.  Mild gyriform hemorrhage right frontal lobe. Mass effect upon the  right lateral ventricle without midline shift.  Small infarcts left corpus callosum and cingulate gyrus.  Blood breakdown products anterior left lenticular nucleus and left  caudate head of indeterminate age.  Remote infarct posterior left lenticular nucleus.  Mild small vessel disease type changes.  MRA HEAD FINDINGS  Exam is motion degraded.  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   Echocardiogram 08/01/2013 -Left ventricle: The cavity size was normal. Mild focal basal hypertrophy of the septum.  -LVEF= 60% to 65%.  -(grade 1 diastolic dysfunction). - Mitral valve: Calcified annulus. Mildly thickened leaflets - Left atrium: The atrium was mildly dilated.    Antibiotics:  HPI/Subjective: Kim Chen is a 68 y.o.WF PMHx diabetes type 2, HTN, nicotine abuse, Hx  stroke presented to the ED with increased confusion weakness and facial drooling. Patient now seems to be improving. She staets that she appeared to have gotten weak and fell  back into her chair. She at the time did not have a headache although she now states that she has one. She states that she had no loss of consciousness. She has had no seizure activity. Apparently she was in the ED on 3/18 with similar complaints and altered mental status while driving around in the [place] parking lot (per family LOC and refused to allow physicians at any pain to workup problem). She does have a history of high blood pressure and is taking her medications. At the time she was seen she is awake oriented to person place and location. She at times was noted to have some confusion however. 4/11 per family patient also had recent motor 1 PPD x60 years stopped December 2014. Only partially compliant with medication 4/12 after a long talk with the family at bedside they decided to make patient comfort care   Objective: Filed Vitals:   08/02/13 0500 08/02/13 0600 08/02/13 0700 08/02/13 0800  BP: 168/71 175/78 184/79 189/88  Pulse: 99 103 107 108  Temp:   98.2 F (36.8 C)   TempSrc:   Axillary   Resp: 17 21 24 25   Height:      Weight:      SpO2: 98% 91% 89% 91%    Intake/Output Summary (Last 24 hours) at 08/02/13 1022 Last data filed at 08/02/13 0400  Gross per 24 hour  Intake     60 ml  Output   1125 ml  Net  -1065 ml   Filed Weights   07/31/13 1444 07/31/13 2129  Weight: 40.824 kg (90 lb) 38.8 kg (85 lb 8.6 oz)    Exam:   General: A./O x0.,   Cardiovascular: Regular rhythm and rate, negative  murmurs rubs gallops  Respiratory: Diffuse coarse/rhonchi  Abdomen:   Musculoskeletal: Negative pedal edema  Neurologic    Data Reviewed: Basic Metabolic Panel:  Recent Labs Lab 07/31/13 1425 07/31/13 1435  NA 139 138  K 5.2 4.6  CL 96 94*  CO2 32  --   GLUCOSE 310* 280*  BUN 14 14  CREATININE 0.50 0.70  CALCIUM 10.5  --    Liver Function Tests:  Recent Labs Lab 07/31/13 1425  AST 21  ALT 18  ALKPHOS 105  BILITOT 0.5  PROT 7.5  ALBUMIN 4.2   No  results found for this basename: LIPASE, AMYLASE,  in the last 168 hours No results found for this basename: AMMONIA,  in the last 168 hours CBC:  Recent Labs Lab 07/31/13 1425 07/31/13 1435  WBC 13.2*  --   NEUTROABS 10.5*  --   HGB 16.7* 17.7*  HCT 47.1* 52.0*  MCV 88.4  --   PLT 244  --    Cardiac Enzymes: No results found for this basename: CKTOTAL, CKMB, CKMBINDEX, TROPONINI,  in the last 168 hours BNP (last 3 results) No results found for this basename: PROBNP,  in the last 8760 hours CBG:  Recent Labs Lab 08/01/13 1545 08/01/13 2151 08/02/13 0009 08/02/13 0405 08/02/13 0741  GLUCAP 219* 206* 238* 104* 142*    Recent Results (from the past 240 hour(s))  MRSA PCR SCREENING     Status: None   Collection Time    08/01/13  1:32 AM      Result Value Ref Range Status   MRSA by PCR NEGATIVE  NEGATIVE Final   Comment:            The GeneXpert MRSA Assay (FDA     approved for NASAL specimens     only), is one component of a     comprehensive MRSA colonization     surveillance program. It is not     intended to diagnose MRSA     infection nor to guide or     monitor treatment for     MRSA infections.     Studies: Ct Angio Head W/cm &/or Wo Cm  07/31/2013   CLINICAL DATA:  Code stroke, evaluate.  EXAM: CT ANGIOGRAPHY HEAD  TECHNIQUE: Multidetector CT imaging of the head was performed using the standard protocol during bolus administration of intravenous contrast. Multiplanar CT image reconstructions and MIPs were obtained to evaluate the vascular anatomy.  CONTRAST:  80mL OMNIPAQUE IOHEXOL 350 MG/ML SOLN  COMPARISON:  CT HEAD W/O CM dated 07/31/2013  FINDINGS: Anterior circulation: Complete loss of contrast opacification of the included right cervical, petrous internal carotid artery. Mild hypo dense contrast opacification of the right distal cavernous internal carotid artery, with apparent high-grade stenosis of the anterior genu of the right internal carotid artery,  axial 35/79. Robust right carotid terminus contrast opacification, with tiny posterior communicating artery and diminutive but patent left A1 segment. Robust left posterior communicating artery is present. Supernumerary anterior cerebral artery with patent proximal anterior and middle cerebral arteries, however there is paucity of distal right middle cerebral artery and anterior cerebral artery vessels, which may slow flow.  Posterior circulation: Right vertebral artery is dominant, the left vertebral artery predominantly terminates in left posterior-inferior cerebellar artery. Patent basilar artery and main branch vessels. Normal appearance of the posterior cerebral arteries.  No aneurysm, no suspicious luminal irregularity.  Slight subcentimeter wedge-like enhancement of the right occipital (axial 20/32, series 7) and right inferior frontal  lobe (axial 19/32, series 7), of unclear clinical significance.  Review of the MIP images confirms the above findings.  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 This could be further characterized with dedicated CT angiogram of the neck.  Apparent high-grade stenosis of the anterior genu of the right internal carotid artery.  Relative paucity of distal anterior and middle cerebral artery vessels, favored to reflect delayed flow due to the proximal ICA occlusion.  Very slight enhancement of the right inferior frontal lobe and right occipital lung may reflect partial voluming of cortical veins or possibly subacute ischemia, and would be better characterized on MRI of the brain with contrast as clinically indicated.  Acute findings discussed with and reconfirmed by [facility] COOK on4/10/2015at4:55 PM.   Electronically Signed   By: Awilda Metro   On: 07/31/2013 17:01   Dg Chest 2 View  08/01/2013   CLINICAL DATA:  Stroke.  EXAM: CHEST  2 VIEW  COMPARISON:  07/08/2013  FINDINGS: The heart size  and mediastinal contours are within normal limits. Both lungs are clear. The visualized skeletal structures are unremarkable.  IMPRESSION: No active cardiopulmonary disease.   Electronically Signed   By: Burman Nieves M.D.   On: 08/01/2013 01:55   Ct Head Wo Contrast  07/31/2013   CLINICAL DATA:  Code stroke, sudden weakness, slurred speech  EXAM: CT HEAD WITHOUT CONTRAST  TECHNIQUE: Contiguous axial images were obtained from the base of the skull through the vertex without intravenous contrast.  COMPARISON:  07/08/2013  FINDINGS: Mild generalized atrophy.  Normal ventricular morphology.  No midline shift or mass effect.  Scattered small vessel chronic ischemic changes of deep cerebral white matter, most focal in right parietal region, stable.  No intracranial hemorrhage, mass lesion or evidence acute infarction.  No extra-axial fluid collections.  Atherosclerotic calcification at the carotid siphons bilaterally.  Nasal septal deviation to the left.  No acute bone or sinus abnormalities.  IMPRESSION: Small vessel chronic ischemic changes of deep cerebral white matter.  No acute intracranial abnormalities.  Critical Value/emergent results were called by telephone at the time of interpretation on 07/31/2013 at 1423 hr to Dr. Donnetta Hutching , who verbally acknowledged these results.   Electronically Signed   By: Ulyses Southward M.D.   On: 07/31/2013 14:31   Mr Brain Wo Contrast  08/01/2013   CLINICAL DATA:  Hypertension.  Diabetes.  Weakness.  Slurred speech.  EXAM: MRI HEAD WITHOUT CONTRAST  MRA HEAD WITHOUT CONTRAST  TECHNIQUE: Multiplanar, multiecho pulse sequences of the brain and surrounding structures were obtained without intravenous contrast. Angiographic images of the head were obtained using MRA technique without contrast.  COMPARISON:  CT angiogram and CT head 07/31/2013.  FINDINGS: MRI HEAD FINDINGS  Exam is motion degraded.  Large right hemispheric acute infarct involving majority of the right frontal lobe  and portions of the right parietal lobe, temporal lobe, basal ganglia, subinsular region and peri operculum region. Mild gyriform hemorrhage right frontal lobe. Mass effect upon the right lateral ventricle without midline shift.  Small infarcts left corpus callosum and cingulate gyrus.  Blood breakdown products anterior left lenticular nucleus and left caudate head of indeterminate age.  Remote infarct posterior left lenticular nucleus.  Mild small vessel disease type changes.  No hydrocephalus.  No intracranial mass lesion noted on this unenhanced motion degraded exam.  MRA HEAD FINDINGS  Exam is motion degraded.  Right internal carotid artery and left vertebral artery appear to  be occluded.  No well defined collateral flow.  Nonvisualized left anterior cerebral artery. Narrowing left middle cerebral artery bifurcation and branch vessels.  Irregularity with mild to slightly moderate tandem stenosis distal right vertebral artery and proximal basilar artery.  Poor delineation majority of the posterior circulation branch vessels.  Limited for evaluating for presence of an aneurysm given the motion.  IMPRESSION: MRI HEAD :  Exam is motion degraded.  Large right hemispheric acute infarct involving majority of the right frontal lobe and portions of the right parietal lobe, temporal lobe, basal ganglia, subinsular region and peri operculum region. Mild gyriform hemorrhage right frontal lobe. Mass effect upon the right lateral ventricle without midline shift.  Small infarcts left corpus callosum and cingulate gyrus.  Blood breakdown products anterior left lenticular nucleus and left caudate head of indeterminate age.  Remote infarct posterior left lenticular nucleus.  Mild small vessel disease type changes.  MRA HEAD FINDINGS  Exam is motion degraded.  Right internal carotid artery and left vertebral artery appear to be occluded.  These results were called by telephone at the time of interpretation on 08/01/2013 at 8:47 PM  to Estill Bakes nurse who verbally acknowledged these results.   Electronically Signed   By: Bridgett Larsson M.D.   On: 08/01/2013 20:47   Mr Maxine Glenn Head/brain Wo Cm  08/01/2013   CLINICAL DATA:  Hypertension.  Diabetes.  Weakness.  Slurred speech.  EXAM: MRI HEAD WITHOUT CONTRAST  MRA HEAD WITHOUT CONTRAST  TECHNIQUE: Multiplanar, multiecho pulse sequences of the brain and surrounding structures were obtained without intravenous contrast. Angiographic images of the head were obtained using MRA technique without contrast.  COMPARISON:  CT angiogram and CT head 07/31/2013.  FINDINGS: MRI HEAD FINDINGS  Exam is motion degraded.  Large right hemispheric acute infarct involving majority of the right frontal lobe and portions of the right parietal lobe, temporal lobe, basal ganglia, subinsular region and peri operculum region. Mild gyriform hemorrhage right frontal lobe. Mass effect upon the right lateral ventricle without midline shift.  Small infarcts left corpus callosum and cingulate gyrus.  Blood breakdown products anterior left lenticular nucleus and left caudate head of indeterminate age.  Remote infarct posterior left lenticular nucleus.  Mild small vessel disease type changes.  No hydrocephalus.  No intracranial mass lesion noted on this unenhanced motion degraded exam.  MRA HEAD FINDINGS  Exam is motion degraded.  Right internal carotid artery and left vertebral artery appear to be occluded.  No well defined collateral flow.  Nonvisualized left anterior cerebral artery. Narrowing left middle cerebral artery bifurcation and branch vessels.  Irregularity with mild to slightly moderate tandem stenosis distal right vertebral artery and proximal basilar artery.  Poor delineation majority of the posterior circulation branch vessels.  Limited for evaluating for presence of an aneurysm given the motion.  IMPRESSION: MRI HEAD :  Exam is motion degraded.  Large right hemispheric acute infarct involving majority of the  right frontal lobe and portions of the right parietal lobe, temporal lobe, basal ganglia, subinsular region and peri operculum region. Mild gyriform hemorrhage right frontal lobe. Mass effect upon the right lateral ventricle without midline shift.  Small infarcts left corpus callosum and cingulate gyrus.  Blood breakdown products anterior left lenticular nucleus and left caudate head of indeterminate age.  Remote infarct posterior left lenticular nucleus.  Mild small vessel disease type changes.  MRA HEAD FINDINGS  Exam is motion degraded.  Right internal carotid artery and left vertebral artery appear to be occluded.  These results were called by telephone at the time of interpretation on 08/01/2013 at 8:47 PM to Estill Bakes nurse who verbally acknowledged these results.   Electronically Signed   By: Bridgett Larsson M.D.   On: 08/01/2013 20:47    Scheduled Meds: . amLODipine  10 mg Oral Daily  . aspirin  300 mg Rectal Daily   Or  . aspirin  325 mg Oral Daily  . cholecalciferol  1,000 Units Oral Daily  . heparin  5,000 Units Subcutaneous 3 times per day  . ibuprofen  200 mg Oral Daily  . insulin aspart  0-15 Units Subcutaneous 6 times per day  . insulin detemir  8 Units Subcutaneous QHS  . irbesartan  300 mg Oral Daily  . metFORMIN  1,000 mg Oral BID WC  . zolpidem  5 mg Oral QHS   Continuous Infusions:   Principal Problem:   Acute embolic stroke Active Problems:   Hypertension   Diabetes   Protein-calorie malnutrition, severe   HLD (hyperlipidemia)   Nicotine abuse    Time spent: 60 min    Drema Dallas  [facility] Pager [phone]. If 7PM-7AM, please contact night-coverage at [email or web address], password [facility] 08/02/2013, 10:22 AM  LOS: 2 days

## 2013-08-02 NOTE — Progress Notes (Signed)
Notified Dr. Joseph ArtWoods of pt's vital signs (tachycardic and tachypneic). Order received for IV Morphine. Viona Gilmorehristy Lee [employer]

## 2013-08-02 NOTE — Progress Notes (Signed)
Stroke Team Progress Note  HISTORY  Kim Chen is an 68 y.o. 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  She was initially brought by ambulance to [facility] due to acute onset confusion with right sided weakness, slurred speech, and right gaze preference.  Conflicting information about when she was last known well, but one of her family members is at the bedside and told me that she was fine and able to work on Wednesday. However, review of her clinical record indicated that she was seen in the ED on 3/18 with similar complaints and altered mental status while driving around in the [place] parking lot.  In any case, patient was apparently talking to his sister approximately 1 PM today when her sister noted patient had slurred speech. EMS was notified and they had to break in the house. She was found on the floor but conscious.  A code stroke was activated at [facility]. CT brain showed no acute abnormality. She improved while in he ED and after discussion with tele neurology she was deemed not a candidate for thrombolysis.  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  Presently, complains of feeling hungry and having neck soreness but denies HA, vertigo, double vision, visual disturbances, chest pain, or palpitations.  Date last known well: uncertain  Time last known well: uncertain  tPA Given: no, unclear time of onset    SUBJECTIVE Family is at bedside, daughter and son. The patient is not alert, will not verbalize.  OBJECTIVE Most recent Vital Signs: Filed Vitals:   08/02/13 0400 08/02/13 0500 08/02/13 0600 08/02/13 0700  BP: 180/86 168/71 175/78 184/79   Pulse: 106 99 103 107  Temp: 98.4 F (36.9 C)   98.2 F (36.8 C)  TempSrc: Axillary   Axillary  Resp: 22 17 21 24   Height:      Weight:      SpO2: 94% 98% 91% 89%   CBG (last 3)   Recent Labs  08/01/13 2151 08/02/13 0009 08/02/13 0405  GLUCAP 206* 238* 104*    IV Fluid Intake:     MEDICATIONS  . amLODipine  10 mg Oral Daily  . aspirin  300 mg Rectal Daily   Or  . aspirin  325 mg Oral Daily  . cholecalciferol  1,000 Units Oral Daily  . heparin  5,000 Units Subcutaneous 3 times per day  . ibuprofen  200 mg Oral Daily  . insulin aspart  0-15 Units Subcutaneous 6 times per day  . insulin detemir  8 Units Subcutaneous QHS  . irbesartan  300 mg Oral Daily  . metFORMIN  1,000 mg Oral BID WC  . zolpidem  5 mg Oral QHS   PRN:  acetaminophen, acetaminophen, loperamide, LORazepam, senna-docusate  Diet:  NPO  Activity:  Up with assistance DVT Prophylaxis:  SQ heparin  CLINICALLY SIGNIFICANT STUDIES Basic Metabolic Panel:   Recent Labs Lab 07/31/13 1425 07/31/13 1435  NA 139 138  K 5.2 4.6  CL 96 94*  CO2 32  --   GLUCOSE 310* 280*  BUN 14 14  CREATININE 0.50 0.70  CALCIUM 10.5  --    Liver Function Tests:   Recent Labs Lab 07/31/13 1425  AST 21  ALT 18  ALKPHOS 105  BILITOT 0.5  PROT 7.5  ALBUMIN 4.2   CBC:   Recent Labs Lab 07/31/13 1425 07/31/13 1435  WBC 13.2*  --   NEUTROABS 10.5*  --   HGB 16.7* 17.7*  HCT 47.1* 52.0*  MCV 88.4  --   PLT 244  --    Coagulation:   Recent Labs Lab 07/31/13 1425  LABPROT 13.1  INR 1.01   Cardiac Enzymes: No results found for this basename: CKTOTAL, CKMB, CKMBINDEX, TROPONINI,  in the last 168 hours Urinalysis:   Recent Labs Lab 07/31/13 1714  COLORURINE YELLOW  LABSPEC 1.010  PHURINE 7.0  GLUCOSEU >1000*  HGBUR NEGATIVE  BILIRUBINUR NEGATIVE  KETONESUR NEGATIVE  PROTEINUR NEGATIVE  UROBILINOGEN 0.2  NITRITE NEGATIVE  LEUKOCYTESUR NEGATIVE   Lipid Panel    Component Value Date/Time    CHOL 181 08/02/2013 0019   TRIG 113 08/02/2013 0019   HDL 61 08/02/2013 0019   CHOLHDL 3.0 08/02/2013 0019   VLDL 23 08/02/2013 0019   LDLCALC 97 08/02/2013 0019   HgbA1C  Lab Results  Component Value Date   HGBA1C 12.2* 08/01/2013    Urine Drug Screen:     Component Value Date/Time   LABOPIA NONE DETECTED 07/31/2013 1714   COCAINSCRNUR NONE DETECTED 07/31/2013 1714   LABBENZ NONE DETECTED 07/31/2013 1714   AMPHETMU NONE DETECTED 07/31/2013 1714   THCU NONE DETECTED 07/31/2013 1714   LABBARB NONE DETECTED 07/31/2013 1714    Alcohol Level:   Recent Labs Lab 07/31/13 1432  ETH <11    Ct Angio Head W/cm &/or Wo Cm  07/31/2013   CLINICAL DATA:  Code stroke, evaluate.  EXAM: CT ANGIOGRAPHY HEAD  TECHNIQUE: Multidetector CT imaging of the head was performed using the standard protocol during bolus administration of intravenous contrast. Multiplanar CT image reconstructions and MIPs were obtained to evaluate the vascular anatomy.  CONTRAST:  80mL OMNIPAQUE IOHEXOL 350 MG/ML SOLN  COMPARISON:  CT HEAD W/O CM dated 07/31/2013  FINDINGS: Anterior circulation: Complete loss of contrast opacification of the included right cervical, petrous internal carotid artery. Mild hypo dense contrast opacification of the right distal cavernous internal carotid artery, with apparent high-grade stenosis of the anterior genu of the right internal carotid artery, axial 35/79. Robust right carotid terminus contrast opacification, with tiny posterior communicating artery and diminutive but patent left A1 segment. Robust left posterior communicating artery is present. Supernumerary anterior cerebral artery with patent proximal anterior and middle cerebral arteries, however there is paucity of distal right middle cerebral artery and anterior cerebral artery vessels, which may slow flow.  Posterior circulation: Right vertebral artery is dominant, the left vertebral artery predominantly terminates in left posterior-inferior  cerebellar artery. Patent basilar artery and main branch vessels. Normal appearance of the posterior cerebral arteries.  No aneurysm, no suspicious luminal irregularity.  Slight subcentimeter wedge-like enhancement of the right occipital (axial 20/32, series 7) and right inferior frontal lobe (axial 19/32, series 7), of unclear clinical significance.  Review of the MIP images confirms the above findings.  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 This could be further characterized with dedicated CT angiogram of the neck.  Apparent high-grade stenosis of the anterior genu of the right internal carotid artery.  Relative paucity of distal anterior and middle cerebral artery vessels, favored to reflect delayed flow due to the proximal ICA occlusion.  Very slight enhancement of the right inferior frontal lobe and right occipital  lung may reflect partial voluming of cortical veins or possibly subacute ischemia, and would be better characterized on MRI of the brain with contrast as clinically indicated.  Acute findings discussed with and reconfirmed by [facility]RI[facility] COOK on4/10/2015at4:55 PM.   Electronically Signed   By: Awilda Metroourtnay  Bloomer   On: 07/31/2013 17:01   Dg Chest 2 View  08/01/2013   CLINICAL DATA:  Stroke.  EXAM: CHEST  2 VIEW  COMPARISON:  07/08/2013  FINDINGS: The heart size and mediastinal contours are within normal limits. Both lungs are clear. The visualized skeletal structures are unremarkable.  IMPRESSION: No active cardiopulmonary disease.   Electronically Signed   By: Burman NievesWilliam  Stevens M.D.   On: 08/01/2013 01:55   Ct Head Wo Contrast  07/31/2013   CLINICAL DATA:  Code stroke, sudden weakness, slurred speech  EXAM: CT HEAD WITHOUT CONTRAST  TECHNIQUE: Contiguous axial images were obtained from the base of the skull through the vertex without intravenous contrast.  COMPARISON:  07/08/2013  FINDINGS: Mild generalized  atrophy.  Normal ventricular morphology.  No midline shift or mass effect.  Scattered small vessel chronic ischemic changes of deep cerebral white matter, most focal in right parietal region, stable.  No intracranial hemorrhage, mass lesion or evidence acute infarction.  No extra-axial fluid collections.  Atherosclerotic calcification at the carotid siphons bilaterally.  Nasal septal deviation to the left.  No acute bone or sinus abnormalities.  IMPRESSION: Small vessel chronic ischemic changes of deep cerebral white matter.  No acute intracranial abnormalities.  Critical Value/emergent results were called by telephone at the time of interpretation on 07/31/2013 at 1423 hr to Dr. Donnetta HutchingBRIAN COOK , who verbally acknowledged these results.   Electronically Signed   By: Ulyses SouthwardMark  Boles M.D.   On: 07/31/2013 14:31   Mr Brain Wo Contrast  08/01/2013   CLINICAL DATA:  Hypertension.  Diabetes.  Weakness.  Slurred speech.  EXAM: MRI HEAD WITHOUT CONTRAST  MRA HEAD WITHOUT CONTRAST  TECHNIQUE: Multiplanar, multiecho pulse sequences of the brain and surrounding structures were obtained without intravenous contrast. Angiographic images of the head were obtained using MRA technique without contrast.  COMPARISON:  CT angiogram and CT head 07/31/2013.  FINDINGS: MRI HEAD FINDINGS  Exam is motion degraded.  Large right hemispheric acute infarct involving majority of the right frontal lobe and portions of the right parietal lobe, temporal lobe, basal ganglia, subinsular region and peri operculum region. Mild gyriform hemorrhage right frontal lobe. Mass effect upon the right lateral ventricle without midline shift.  Small infarcts left corpus callosum and cingulate gyrus.  Blood breakdown products anterior left lenticular nucleus and left caudate head of indeterminate age.  Remote infarct posterior left lenticular nucleus.  Mild small vessel disease type changes.  No hydrocephalus.  No intracranial mass lesion noted on this unenhanced  motion degraded exam.  MRA HEAD FINDINGS  Exam is motion degraded.  Right internal carotid artery and left vertebral artery appear to be occluded.  No well defined collateral flow.  Nonvisualized left anterior cerebral artery. Narrowing left middle cerebral artery bifurcation and branch vessels.  Irregularity with mild to slightly moderate tandem stenosis distal right vertebral artery and proximal basilar artery.  Poor delineation majority of the posterior circulation branch vessels.  Limited for evaluating for presence of an aneurysm given the motion.  IMPRESSION: MRI HEAD :  Exam is motion degraded.  Large right hemispheric acute infarct involving majority of the right frontal lobe and portions of the right parietal lobe, temporal lobe, basal ganglia,  subinsular region and peri operculum region. Mild gyriform hemorrhage right frontal lobe. Mass effect upon the right lateral ventricle without midline shift.  Small infarcts left corpus callosum and cingulate gyrus.  Blood breakdown products anterior left lenticular nucleus and left caudate head of indeterminate age.  Remote infarct posterior left lenticular nucleus.  Mild small vessel disease type changes.  MRA HEAD FINDINGS  Exam is motion degraded.  Right internal carotid artery and left vertebral artery appear to be occluded.  These results were called by telephone at the time of interpretation on 08/01/2013 at 8:47 PM to Estill Bakes nurse who verbally acknowledged these results.   Electronically Signed   By: Bridgett Larsson M.D.   On: 08/01/2013 20:47   Mr Maxine Glenn Head/brain Wo Cm  08/01/2013   CLINICAL DATA:  Hypertension.  Diabetes.  Weakness.  Slurred speech.  EXAM: MRI HEAD WITHOUT CONTRAST  MRA HEAD WITHOUT CONTRAST  TECHNIQUE: Multiplanar, multiecho pulse sequences of the brain and surrounding structures were obtained without intravenous contrast. Angiographic images of the head were obtained using MRA technique without contrast.  COMPARISON:  CT angiogram  and CT head 07/31/2013.  FINDINGS: MRI HEAD FINDINGS  Exam is motion degraded.  Large right hemispheric acute infarct involving majority of the right frontal lobe and portions of the right parietal lobe, temporal lobe, basal ganglia, subinsular region and peri operculum region. Mild gyriform hemorrhage right frontal lobe. Mass effect upon the right lateral ventricle without midline shift.  Small infarcts left corpus callosum and cingulate gyrus.  Blood breakdown products anterior left lenticular nucleus and left caudate head of indeterminate age.  Remote infarct posterior left lenticular nucleus.  Mild small vessel disease type changes.  No hydrocephalus.  No intracranial mass lesion noted on this unenhanced motion degraded exam.  MRA HEAD FINDINGS  Exam is motion degraded.  Right internal carotid artery and left vertebral artery appear to be occluded.  No well defined collateral flow.  Nonvisualized left anterior cerebral artery. Narrowing left middle cerebral artery bifurcation and branch vessels.  Irregularity with mild to slightly moderate tandem stenosis distal right vertebral artery and proximal basilar artery.  Poor delineation majority of the posterior circulation branch vessels.  Limited for evaluating for presence of an aneurysm given the motion.  IMPRESSION: MRI HEAD :  Exam is motion degraded.  Large right hemispheric acute infarct involving majority of the right frontal lobe and portions of the right parietal lobe, temporal lobe, basal ganglia, subinsular region and peri operculum region. Mild gyriform hemorrhage right frontal lobe. Mass effect upon the right lateral ventricle without midline shift.  Small infarcts left corpus callosum and cingulate gyrus.  Blood breakdown products anterior left lenticular nucleus and left caudate head of indeterminate age.  Remote infarct posterior left lenticular nucleus.  Mild small vessel disease type changes.  MRA HEAD FINDINGS  Exam is motion degraded.  Right  internal carotid artery and left vertebral artery appear to be occluded.  These results were called by telephone at the time of interpretation on 08/01/2013 at 8:47 PM to Estill Bakes nurse who verbally acknowledged these results.   Electronically Signed   By: Bridgett Larsson M.D.   On: 08/01/2013 20:47    CT of the brain   IMPRESSION:  Small vessel chronic ischemic changes of deep cerebral white matter.  No acute intracranial abnormalities.    CTA Head  IMPRESSION:  Right internal carotid artery occlusion with retrograde flow via  posterior and anterior communicating arteries. This could be further  characterized with dedicated CT angiogram of the neck.  Apparent high-grade stenosis of the anterior genu of the right  internal carotid artery.  Relative paucity of distal anterior and middle cerebral artery  vessels, favored to reflect delayed flow due to the proximal ICA  occlusion.  Very slight enhancement of the right inferior frontal lobe and right  occipital lung may reflect partial voluming of cortical veins or  possibly subacute ischemia, and would be better characterized on MRI  of the brain with contrast as clinically indicated.  MRI of the brain   IMPRESSION:  MRI HEAD :  Exam is motion degraded.  Large right hemispheric acute infarct involving majority of the  right frontal lobe and portions of the right parietal lobe, temporal  lobe, basal ganglia, subinsular region and peri operculum region.  Mild gyriform hemorrhage right frontal lobe. Mass effect upon the  right lateral ventricle without midline shift.  Small infarcts left corpus callosum and cingulate gyrus.  Blood breakdown products anterior left lenticular nucleus and left  caudate head of indeterminate age.  Remote infarct posterior left lenticular nucleus.  Mild small vessel disease type changes.  MRA of the brain   MRA HEAD FINDINGS  Exam is motion degraded.  Right internal carotid artery and left  vertebral artery appear to be  occluded.  2D Echocardiogram   Study Conclusions  - Left ventricle: The cavity size was normal. There was mild focal basal hypertrophy of the septum. Systolic function was normal. The estimated ejection fraction was in the range of 60% to 65%. Wall motion was normal; there were no regional wall motion abnormalities. Doppler parameters are consistent with abnormal left ventricular relaxation (grade 1 diastolic dysfunction). - Mitral valve: Calcified annulus. Mildly thickened leaflets . - Left atrium: The atrium was mildly dilated.   Carotid Doppler    CXR   IMPRESSION:  No active cardiopulmonary disease.  EKG   Normal sinus rhythm Cannot rule out Anterior infarct , age undetermined Abnormal ECG When compared with ECG of 08-Jul-2013 12:18, ST no longer depressed in Inferior leads T wave inversion no longer evident in Inferior leads  Therapy Recommendations pending  Physical Exam  General: The patient is somnolent at the time of examination.  Skin: No significant peripheral edema is noted.   Neurologic Exam  Mental status: The patient is somnolent, nonverbal.  Cranial nerves: Facial symmetry is not present. There is depression of the left nasolabial fold. Speech is normal, no aphasia or dysarthria is noted. Extraocular movements are restricted, unable to initiate conjugate gaze to the left. The patient has a dense left homonymous visual field deficit to threat.  Motor: The patient has good strength in the right extremities. On the left side, the left arm is near plegic, the patient has minimal movement of the left leg.  Sensory examination: The patient will respond to deep pain she motion on the right, minimal response on the left.  Coordination: The patient is too lethargic to cooperate for cerebellar testing.  Gait and station: The patient was not ambulated.  Reflexes: Deep tendon reflexes are symmetric.    ASSESSMENT Ms.  Kim Chen is a 68 y.o. female presenting with a large right brain stroke, right internal carotid artery occlusion. The patient was admitted with a left hemiparesis, time of onset is unclear, not a TPA candidate. The patient is being treated with aspirin at this time, not clear that she was on aspirin prior to admission.   Diabetes  Right brain stroke, right internal  carotid artery occlusion  Hypertension  Hospital day # 2  The patient lives alone at home, does have some family on the [facility] [place]. The patient has sustained a large right brain stroke, will likely not return to independent living.  I discussed the case with the family. The patient has sustained a large right brain stroke, and has had increased lethargy secondary to this. The patient is too sleepy to attempt to eat, may require a feeding tube, as this drowsiness may last for 10-14 days. The family does not wish the patient to have CPR, but if the patient develops severe pneumonia, they are amenable to intubation. The patient likely will require an extended care facility, not inpatient rehabilitation.  TREATMENT/PLAN  Aspirin therapy  Carotid Doppler study  Given increased lethargy today, we will repeat CT scan brain  Physical, occupational, and speech therapy evaluation  Will require case manager for discharge planning  Stroke team will follow  York Spaniel  08/02/2013 9:04 AM

## 2013-08-02 NOTE — Progress Notes (Addendum)
Palliative Medicine Team Consult Note  68 yo woman with large right hemispheric stroke, completly occluded R-ICA and left vertebral artery.She has declined since admission-she is febrile and unresponsive. Palliative consulted for goals of care. This patient had stated to family that she would never desire a QOL if it was in a SNF or she was not fully independent-stoke was one of her worst fears. They are requesting full comfort given very poor prognosis for meaningful recovery and preservation of QOL.  I met with her daughter, who is here from [facility], and her SIL. She also has a son. She is from [place], [place] where she worked for [employer]. According to family and friends she was a high energy person, worked hard everyday of her life. Her daughter says that she can remember growing up that her mother could never sit down and always had to be "doing" something.  Assessment: 1. Acute Decline secondary to R- ICA Occlusion and Right Hemispheric stroke 2. 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 She is showing signs of distress-tachypnea, grimacing and is agitated.  Prognosis: hours-days PPS: 10%  1. DNR 2. Comfort Measures Only 3. Initiate morphine infusion  4. Scheduled diazpam for seizure prophylaxis 5. No extra IV fluids, no feeding tube.  Anticipate hospital death.   Time: 1PM-2PM  50 minutes. Greater than 50%  of this time was spent counseling and coordinating care related to the above assessment and plan.    Lane Hacker, DO Palliative Medicine

## 2013-08-02 NOTE — Progress Notes (Signed)
Had a thorough discussions with pt's daughter (and her husband) about decisions pertaining to code status. They express gratitude for information and will discuss together and with family. Viona Gilmorehristy Lee SCANA Corporationolden

## 2013-08-03 ENCOUNTER — Encounter (HOSPITAL_COMMUNITY): Payer: Self-pay | Admitting: Internal Medicine

## 2013-08-03 ENCOUNTER — Inpatient Hospital Stay (HOSPITAL_COMMUNITY)
Admission: AD | Admit: 2013-08-03 | Discharge: 2013-08-21 | DRG: 064 | Disposition: E | Source: Ambulatory Visit | Attending: Internal Medicine | Admitting: Internal Medicine

## 2013-08-03 DIAGNOSIS — R1310 Dysphagia, unspecified: Secondary | ICD-10-CM | POA: Diagnosis present

## 2013-08-03 DIAGNOSIS — R4789 Other speech disturbances: Secondary | ICD-10-CM | POA: Diagnosis present

## 2013-08-03 DIAGNOSIS — I639 Cerebral infarction, unspecified: Secondary | ICD-10-CM | POA: Diagnosis present

## 2013-08-03 DIAGNOSIS — R509 Fever, unspecified: Secondary | ICD-10-CM | POA: Diagnosis present

## 2013-08-03 DIAGNOSIS — I10 Essential (primary) hypertension: Secondary | ICD-10-CM | POA: Diagnosis present

## 2013-08-03 DIAGNOSIS — E785 Hyperlipidemia, unspecified: Secondary | ICD-10-CM | POA: Diagnosis present

## 2013-08-03 DIAGNOSIS — I6521 Occlusion and stenosis of right carotid artery: Secondary | ICD-10-CM | POA: Diagnosis present

## 2013-08-03 DIAGNOSIS — Z515 Encounter for palliative care: Secondary | ICD-10-CM

## 2013-08-03 DIAGNOSIS — F17200 Nicotine dependence, unspecified, uncomplicated: Secondary | ICD-10-CM | POA: Diagnosis present

## 2013-08-03 DIAGNOSIS — I6350 Cerebral infarction due to unspecified occlusion or stenosis of unspecified cerebral artery: Principal | ICD-10-CM | POA: Diagnosis present

## 2013-08-03 DIAGNOSIS — E119 Type 2 diabetes mellitus without complications: Secondary | ICD-10-CM | POA: Diagnosis present

## 2013-08-03 DIAGNOSIS — I6529 Occlusion and stenosis of unspecified carotid artery: Secondary | ICD-10-CM | POA: Diagnosis present

## 2013-08-03 DIAGNOSIS — J9600 Acute respiratory failure, unspecified whether with hypoxia or hypercapnia: Secondary | ICD-10-CM | POA: Diagnosis present

## 2013-08-03 DIAGNOSIS — I69959 Hemiplegia and hemiparesis following unspecified cerebrovascular disease affecting unspecified side: Secondary | ICD-10-CM

## 2013-08-03 MED ORDER — MORPHINE BOLUS VIA INFUSION
2.0000 mg | INTRAVENOUS | Status: DC | PRN
  Filled 2013-08-03: qty 2

## 2013-08-03 MED ORDER — SCOPOLAMINE 1 MG/3DAYS TD PT72
1.0000 | MEDICATED_PATCH | TRANSDERMAL | Status: DC
  Administered 2013-08-03: 1.5 mg via TRANSDERMAL
  Filled 2013-08-03: qty 1

## 2013-08-03 MED ORDER — ATROPINE SULFATE 1 % OP SOLN
2.0000 [drp] | OPHTHALMIC | Status: DC | PRN
  Filled 2013-08-03: qty 2

## 2013-08-03 MED ORDER — KETOROLAC TROMETHAMINE 30 MG/ML IJ SOLN
30.0000 mg | Freq: Four times a day (QID) | INTRAMUSCULAR | Status: DC | PRN
  Administered 2013-08-03: 30 mg via INTRAVENOUS

## 2013-08-03 MED ORDER — ACETAMINOPHEN 650 MG RE SUPP: 650.0000 mg | RECTAL | Status: DC | PRN

## 2013-08-03 MED ORDER — KETOROLAC TROMETHAMINE 30 MG/ML IJ SOLN
30.0000 mg | Freq: Four times a day (QID) | INTRAMUSCULAR | Status: DC | PRN
  Filled 2013-08-03: qty 1

## 2013-08-03 MED ORDER — SODIUM CHLORIDE 0.9 % IV SOLN: INTRAVENOUS | Status: DC

## 2013-08-03 MED ORDER — SODIUM CHLORIDE 0.9 % IV SOLN
1.0000 mg/h | INTRAVENOUS | Status: DC
  Filled 2013-08-03: qty 10

## 2013-08-03 MED ORDER — DIAZEPAM 5 MG/ML IJ SOLN
5.0000 mg | Freq: Two times a day (BID) | INTRAMUSCULAR | Status: DC
  Administered 2013-08-03: 5 mg via INTRAVENOUS
  Filled 2013-08-03: qty 2

## 2013-08-03 MED ORDER — LORAZEPAM 2 MG/ML IJ SOLN: 1.0000 mg | INTRAMUSCULAR | Status: DC | PRN

## 2013-08-03 MED ORDER — SCOPOLAMINE 1 MG/3DAYS TD PT72: 1.0000 | MEDICATED_PATCH | TRANSDERMAL | Status: DC

## 2013-08-03 NOTE — Progress Notes (Signed)
Nutrition Brief Note  Chart reviewed. Pt now transitioning to comfort care.  No further nutrition interventions warranted at this time.  Please re-consult as needed.   Amjad Fikes MS, RD, LDN Inpatient Registered Dietitian Pager: [phone] After-hours pager: [phone]    

## 2013-08-03 NOTE — Progress Notes (Signed)
Rehab Admissions Coordinator Note:  Patient was screened by Trish MageEugenia M Jaecion Dempster for appropriateness for an Inpatient Acute Rehab Consult.  At this time, we are recommending comfort care per chart.  Noted patient much worse.  If patient improves in function, then could consider inpatient rehab consult.    Ellouise Newerugenia M Aesha Agrawal 07/24/2013, 9:06 AM  I can be reached at [phone].

## 2013-08-03 NOTE — Progress Notes (Signed)
Stroke Team Progress Note  HISTORY Kim Chen is an 68 y.o. 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  She was initially brought by ambulance to [facility] due to acute onset confusion with right sided weakness, slurred speech, and right gaze preference.  Conflicting information about when she was last known well, but one of her family members is at the bedside and told me that she was fine and able to work on Wednesday. However, review of her clinical record indicated that she was seen in the ED on 3/18 with similar complaints and altered mental status while driving around in the [place] parking lot.  In any case, patient was apparently talking to his sister approximately 1 PM today when her sister noted patient had slurred speech. EMS was notified and they had to break in the house. She was found on the floor but conscious.  A code stroke was activated at [facility]. CT brain showed no acute abnormality. She improved while in he ED and after discussion with tele neurology she was deemed not a candidate for thrombolysis.  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  Presently, complains of feeling hungry and having neck soreness but denies HA, vertigo, double vision, visual disturbances, chest pain, or palpitations.  Date last known well: uncertain  Time last known well: uncertain  tPA Given: no, unclear time of onset    SUBJECTIVE Family is at bedside, daughter. The patient is much worse from yesterday. Was made comfort care yesterday.   OBJECTIVE Most recent Vital Signs: Filed Vitals:   08/02/13 1710 08/02/13 1755 08/02/13 1845 08/02/13 2000  BP:      Pulse:  110 106 103 101  Temp:      TempSrc:      Resp: 22 20 18 17   Height:      Weight:      SpO2: 91% 93% 91% 91%   CBG (last 3)   Recent Labs  08/02/13 0405 08/02/13 0741 08/02/13 1236  GLUCAP 104* 142* 164*    IV Fluid Intake:   . morphine 3 mg/hr (08/01/2013 0600)    MEDICATIONS  . diazepam  5 mg Intravenous BID   PRN:  acetaminophen, acetaminophen, LORazepam, morphine  Diet:  NPO  Activity:  Up with assistance DVT Prophylaxis:  SQ heparin  CLINICALLY SIGNIFICANT STUDIES Basic Metabolic Panel:   Recent Labs Lab 07/31/13 1425 07/31/13 1435  NA 139 138  K 5.2 4.6  CL 96 94*  CO2 32  --   GLUCOSE 310* 280*  BUN 14 14  CREATININE 0.50 0.70  CALCIUM 10.5  --    Liver Function Tests:   Recent Labs Lab 07/31/13 1425  AST 21  ALT 18  ALKPHOS 105  BILITOT 0.5  PROT 7.5  ALBUMIN 4.2   CBC:   Recent Labs Lab 07/31/13 1425 07/31/13 1435  WBC 13.2*  --   NEUTROABS 10.5*  --   HGB 16.7* 17.7*  HCT 47.1* 52.0*  MCV 88.4  --   PLT 244  --    Coagulation:   Recent Labs Lab 07/31/13 1425  LABPROT 13.1  INR 1.01   Cardiac Enzymes: No results found for this basename: CKTOTAL, CKMB, CKMBINDEX, TROPONINI,  in  the last 168 hours Urinalysis:   Recent Labs Lab 07/31/13 1714  COLORURINE YELLOW  LABSPEC 1.010  PHURINE 7.0  GLUCOSEU >1000*  HGBUR NEGATIVE  BILIRUBINUR NEGATIVE  KETONESUR NEGATIVE  PROTEINUR NEGATIVE  UROBILINOGEN 0.2  NITRITE NEGATIVE  LEUKOCYTESUR NEGATIVE   Lipid Panel    Component Value Date/Time   CHOL 181 08/02/2013 0019   TRIG 113 08/02/2013 0019   HDL 61 08/02/2013 0019   CHOLHDL 3.0 08/02/2013 0019   VLDL 23 08/02/2013 0019   LDLCALC 97 08/02/2013 0019   HgbA1C  Lab Results  Component Value Date   HGBA1C 12.2* 08/01/2013    Urine Drug Screen:     Component Value Date/Time   LABOPIA NONE DETECTED 07/31/2013 1714   COCAINSCRNUR NONE DETECTED 07/31/2013 1714   LABBENZ NONE DETECTED 07/31/2013 1714   AMPHETMU NONE  DETECTED 07/31/2013 1714   THCU NONE DETECTED 07/31/2013 1714   LABBARB NONE DETECTED 07/31/2013 1714    Alcohol Level:   Recent Labs Lab 07/31/13 1432  ETH <11    Mr Brain Wo Contrast  08/02/2013   ADDENDUM REPORT: 08/02/2013 10:21  ADDENDUM: Please note that on the MR angiogram the left vertebral artery appears occluded however, on CT angiogram of the head performed 07/31/2013, flow within the left vertebral artery is noted which appears to predominantly end in a posterior inferior cerebellar artery distribution. The MR appearance of occlusion of the left vertebral artery is therefore most likely explained by motion artifact.  Right internal carotid artery appears occluded on the CT angiogram with small amount of retrograde flow cavernous and distal petrous segment.  CT angiogram of the neck can be obtained for further delineation if clinically desired and patient can tolerate additional dose of IV contrast.  Results discussed with Dr. Joseph ArtWoods.   Electronically Signed   By: Bridgett LarssonSteve  Olson M.D.   On: 08/02/2013 10:21   08/02/2013   CLINICAL DATA:  Hypertension.  Diabetes.  Weakness.  Slurred speech.  EXAM: MRI HEAD WITHOUT CONTRAST  MRA HEAD WITHOUT CONTRAST  TECHNIQUE: Multiplanar, multiecho pulse sequences of the brain and surrounding structures were obtained without intravenous contrast. Angiographic images of the head were obtained using MRA technique without contrast.  COMPARISON:  CT angiogram and CT head 07/31/2013.  FINDINGS: MRI HEAD FINDINGS  Exam is motion degraded.  Large right hemispheric acute infarct involving majority of the right frontal lobe and portions of the right parietal lobe, temporal lobe, basal ganglia, subinsular region and peri operculum region. Mild gyriform hemorrhage right frontal lobe. Mass effect upon the right lateral ventricle without midline shift.  Small infarcts left corpus callosum and cingulate gyrus.  Blood breakdown products anterior left lenticular nucleus and left  caudate head of indeterminate age.  Remote infarct posterior left lenticular nucleus.  Mild small vessel disease type changes.  No hydrocephalus.  No intracranial mass lesion noted on this unenhanced motion degraded exam.  MRA HEAD FINDINGS  Exam is motion degraded.  Right internal carotid artery and left vertebral artery appear to be occluded.  No well defined collateral flow.  Nonvisualized left anterior cerebral artery. Narrowing left middle cerebral artery bifurcation and branch vessels.  Irregularity with mild to slightly moderate tandem stenosis distal right vertebral artery and proximal basilar artery.  Poor delineation majority of the posterior circulation branch vessels.  Limited for evaluating for presence of an aneurysm given the motion.  IMPRESSION: MRI HEAD :  Exam is motion degraded.  Large right hemispheric acute infarct involving majority of the right  frontal lobe and portions of the right parietal lobe, temporal lobe, basal ganglia, subinsular region and peri operculum region. Mild gyriform hemorrhage right frontal lobe. Mass effect upon the right lateral ventricle without midline shift.  Small infarcts left corpus callosum and cingulate gyrus.  Blood breakdown products anterior left lenticular nucleus and left caudate head of indeterminate age.  Remote infarct posterior left lenticular nucleus.  Mild small vessel disease type changes.  MRA HEAD FINDINGS  Exam is motion degraded.  Right internal carotid artery and left vertebral artery appear to be occluded.  These results were called by telephone at the time of interpretation on 08/01/2013 at 8:47 PM to Estill Bakes nurse who verbally acknowledged these results.  Electronically Signed: By: Bridgett Larsson M.D. On: 08/01/2013 20:47   Mr Maxine Glenn Head/brain Wo Cm  08/02/2013   ADDENDUM REPORT: 08/02/2013 10:21  ADDENDUM: Please note that on the MR angiogram the left vertebral artery appears occluded however, on CT angiogram of the head performed  07/31/2013, flow within the left vertebral artery is noted which appears to predominantly end in a posterior inferior cerebellar artery distribution. The MR appearance of occlusion of the left vertebral artery is therefore most likely explained by motion artifact.  Right internal carotid artery appears occluded on the CT angiogram with small amount of retrograde flow cavernous and distal petrous segment.  CT angiogram of the neck can be obtained for further delineation if clinically desired and patient can tolerate additional dose of IV contrast.  Results discussed with Dr. Joseph Art.   Electronically Signed   By: Bridgett Larsson M.D.   On: 08/02/2013 10:21   08/02/2013   CLINICAL DATA:  Hypertension.  Diabetes.  Weakness.  Slurred speech.  EXAM: MRI HEAD WITHOUT CONTRAST  MRA HEAD WITHOUT CONTRAST  TECHNIQUE: Multiplanar, multiecho pulse sequences of the brain and surrounding structures were obtained without intravenous contrast. Angiographic images of the head were obtained using MRA technique without contrast.  COMPARISON:  CT angiogram and CT head 07/31/2013.  FINDINGS: MRI HEAD FINDINGS  Exam is motion degraded.  Large right hemispheric acute infarct involving majority of the right frontal lobe and portions of the right parietal lobe, temporal lobe, basal ganglia, subinsular region and peri operculum region. Mild gyriform hemorrhage right frontal lobe. Mass effect upon the right lateral ventricle without midline shift.  Small infarcts left corpus callosum and cingulate gyrus.  Blood breakdown products anterior left lenticular nucleus and left caudate head of indeterminate age.  Remote infarct posterior left lenticular nucleus.  Mild small vessel disease type changes.  No hydrocephalus.  No intracranial mass lesion noted on this unenhanced motion degraded exam.  MRA HEAD FINDINGS  Exam is motion degraded.  Right internal carotid artery and left vertebral artery appear to be occluded.  No well defined collateral flow.   Nonvisualized left anterior cerebral artery. Narrowing left middle cerebral artery bifurcation and branch vessels.  Irregularity with mild to slightly moderate tandem stenosis distal right vertebral artery and proximal basilar artery.  Poor delineation majority of the posterior circulation branch vessels.  Limited for evaluating for presence of an aneurysm given the motion.  IMPRESSION: MRI HEAD :  Exam is motion degraded.  Large right hemispheric acute infarct involving majority of the right frontal lobe and portions of the right parietal lobe, temporal lobe, basal ganglia, subinsular region and peri operculum region. Mild gyriform hemorrhage right frontal lobe. Mass effect upon the right lateral ventricle without midline shift.  Small infarcts left corpus callosum and cingulate gyrus.  Blood breakdown products anterior left lenticular nucleus and left caudate head of indeterminate age.  Remote infarct posterior left lenticular nucleus.  Mild small vessel disease type changes.  MRA HEAD FINDINGS  Exam is motion degraded.  Right internal carotid artery and left vertebral artery appear to be occluded.  These results were called by telephone at the time of interpretation on 08/01/2013 at 8:47 PM to Estill BakesWilliam patints nurse who verbally acknowledged these results.  Electronically Signed: By: Bridgett LarssonSteve  Olson M.D. On: 08/01/2013 20:47    CT of the brain   IMPRESSION:  Small vessel chronic ischemic changes of deep cerebral white matter.  No acute intracranial abnormalities.    CTA Head  IMPRESSION:  Right internal carotid artery occlusion with retrograde flow via  posterior and anterior communicating arteries. This could be further  characterized with dedicated CT angiogram of the neck.  Apparent high-grade stenosis of the anterior genu of the right  internal carotid artery.  Relative paucity of distal anterior and middle cerebral artery  vessels, favored to reflect delayed flow due to the proximal ICA   occlusion.  Very slight enhancement of the right inferior frontal lobe and right  occipital lung may reflect partial voluming of cortical veins or  possibly subacute ischemia, and would be better characterized on MRI  of the brain with contrast as clinically indicated.  MRI of the brain   IMPRESSION:  MRI HEAD :  Exam is motion degraded.  Large right hemispheric acute infarct involving majority of the  right frontal lobe and portions of the right parietal lobe, temporal  lobe, basal ganglia, subinsular region and peri operculum region.  Mild gyriform hemorrhage right frontal lobe. Mass effect upon the  right lateral ventricle without midline shift.  Small infarcts left corpus callosum and cingulate gyrus.  Blood breakdown products anterior left lenticular nucleus and left  caudate head of indeterminate age.  Remote infarct posterior left lenticular nucleus.  Mild small vessel disease type changes.  MRA of the brain   MRA HEAD FINDINGS  Exam is motion degraded.  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  2D Echocardiogram   Study Conclusions  - Left ventricle: The cavity size was normal. There was mild focal basal hypertrophy of the septum. Systolic function was normal. The estimated ejection fraction was in the range of 60% to 65%. Wall motion was normal; there were no regional wall motion abnormalities. Doppler parameters are consistent with abnormal left ventricular relaxation (grade 1 diastolic dysfunction). - Mitral valve: Calcified annulus. Mildly thickened leaflets . - Left atrium: The atrium was mildly dilated.   Carotid Doppler    CXR   IMPRESSION:  No active cardiopulmonary disease.  EKG   Normal sinus rhythm Cannot rule out Anterior infarct , age undetermined Abnormal ECG When compared with ECG of 08-Jul-2013 12:18, ST no longer depressed in Inferior leads T wave inversion no longer evident in Inferior  leads  Therapy Recommendations pending  Physical Exam  General: The patient is comatose at the time of examination.  Skin: No significant peripheral edema is noted.   Neurologic Exam  Mental status: The patient is comatose, nonverbal.  Cranial nerves: The patient will not alert to speak. Unresponsive.  Motor: The patient will not follow commands, comatose.  Sensory examination: The patient will not respond to stimulation.  Coordination: The patient is too lethargic to cooperate for cerebellar testing.  Gait and station: The patient was not ambulated.  Reflexes: Deep tendon reflexes  are symmetric.    ASSESSMENT Ms. Kim Chen is a 68 y.o. 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 The patient was admitted with a left hemiparesis, time of onset is unclear, not a TPA candidate. The patient has resultant comatose state, left hemiparesis, dysphagia. Neurologic improvement is poor. family has opted for comfort care.   Diabetes  Right brain stroke, right internal carotid artery occlusion  Hypertension  Hospital day # 3  TREATMENT/PLAN  Agree with transition to comfort care. Stroke Service will sign off. Please call should any needs arise.  Dr. Anne Hahn discussed diagnosis, prognosis,  treatment options and plan of care with family at the bedside.   Annie Main, MSN, RN, ANVP-BC, ANP-BC, [facility] Pager: [phone] 08/15/2013 8:40 AM  I have personally obtained a history, examined the patient, evaluated imaging results, and formulated the assessment and plan of care. I agree with the above.  York Spaniel

## 2013-08-03 NOTE — Progress Notes (Signed)
Ms. Kim Chen is actively dying. She is on a morphine infusion and is febrile. Dyspnea and congestion is worsening. Her [facility] is [facility], and has been at bedside ATC- The patient also has a son who has temporal lobe dementia from a head injury who is having a hard time coping. I recommended a hospice referral-she may qualify for General Inpatient Hospice (GIP) here in the hospital- she is currently not stable enough to move-I also spoke with daughter about a hospice facility should her symptoms stabilize and she was able to move. They are agreeable to all measures oriented towards comfort and getting her into the best care environment possible. Will transfer to 6N.  Anderson MaltaElizabeth Daine Croker, DO Palliative Medicine

## 2013-08-03 NOTE — Progress Notes (Addendum)
[facility] Beckey DowningBarbara Shutes- [facility]- M. Konrad DoloresLester, RN  Referral received, [facility]; hospice dx CVA (436); DNR  code status   Pt seen at bedside unresponsive to voice or light touch; febrile, upper airway congestion noted; irregular breathing RR=18-20 with intermittent 5 second pauses noted; per chart notes agitation, grimacing, tachypnea noted; IV Morphine initiated yesterday, pt has continued to need dose adjustments to maintain comfort - currently on 4mg /hr continuous basal rate; scheduled Valium BID, Scopolamine, Atropine, and Ativan are in place to address increased secretions and restlessness as needed; pt requires ongoing skilled nursing assessment for non-verbal signs of pain/discomfort and per discussion with Dr Phillips OdorGolding is not stable at this time to transfer. Daughter Kim Chen, s-i-l Kim Chen at bedside, dtr tearful; shared they are overwhelmed at patient's quick decline and voice their primary concern is for her to be comfortable;  Returned to see pt at end of day, son Kim Chen had joined family at bedside; dtr feels pt is comfortable she is using cool facecloth to forehead for comfort, stated they are very appreciative of staff care and attention to their mother; Kim Chen voiced awareness of s/sx they are seeing: changes in pt's respirations, temperature , discussed these are expected at EOL; family aware of medications available for comfort and state they feel at this time their mother is comfortable and will make RN aware of any concerns related to her comfort through the evening.   Family is aware [facility] team continues to follow daily and collaborates with attending Dr Phillips OdorGolding Please call [facility] @ [phone]-  with any hospice needs.   Thank you.  Valente DavidMargie Aleesia Henney, RN  [facility]  Hospice Liaison  [phone])

## 2013-08-03 NOTE — Progress Notes (Signed)
Patient feels warm to touch. Axillary temp. Of 101.1 obtained. Cool wash cloth to forehead.

## 2013-08-03 NOTE — Progress Notes (Signed)
Patient is now under hospice care and [facility] To receive Toradol IV.

## 2013-08-03 NOTE — Discharge Summary (Signed)
  DISCHARGE SUMMARY  Kim DowningBarbara Chen  MR#: [id number]  DOB:12-Sep-1945  Date of Admission: 07/31/2013 Date of Discharge: 08/09/2013  Attending Physician:Mukhtar Shams Silvestre Gunner Brennen Camper, MD  Patient's ZDG:[id number],QVZDGLOPCP:DONDIEGO,RICHARD M, MD  Consults: Stroke Team Palliative Care  Disposition: transitioned to full comfort care only - Palliative Care Team to assume attending role   Discharge Diagnoses: Acute CVA - Large right hemispheric acute infarct involving majority of the right frontal lobe and portions of the right parietal lobe, temporal lobe, basal ganglia, subinsular region and peri operculum region HTN DM HLD  Initial presentation: 68 y.o. 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 She appeared to have gotten weak and fell back into a chair. She stated that she had no loss of consciousness. She had no seizure activity. She was previously in the ED on 3/18 with similar complaints and altered mental status [place], but family states she opted not to pursue an extensive evaluation at that time.  She had a history of high blood pressure and was reportedly taking her medications.   Hospital Course: Initial w/u included a CTA of the head which revealed a "right internal carotid artery occlusion with retrograde flow via posterior and anterior communicating arteries - apparent high-grade stenosis of the anterior genu of the right internal carotid artery."  She was not a candidate for thrombolysis due to unclear time of onset of her symptoms.  [facility]  A full stroke evaluation was directed by the Stroke Team.  This included an MRI, which confirmed a very large R brain CVA.  The pt began to decline during her admission, and ultimately became unresponsive.    Palliative Care was consulted, and the family was  gathered.  It was agreed by family that the patient would not desire ongoing aggressive care.  She was transitioned to comfort directed care.  The Palliative Care Team assumed the attending role on 08/13/2013.  Day of Discharge Vitals reviewed by MD. Rapid shallow breathing noted.  Pt is unresponsive.  No evidence of emotional distress or pain.    Time spent in discharge (includes decision making & examination of pt): >30 minutes  08/17/2013, 3:58 PM   Lonia BloodJeffrey T. Malayna Noori, MD [facility] Office  [phone] Pager [phone]  On-Call/Text Page:      [email or web address]      password [facility]

## 2013-08-03 NOTE — Progress Notes (Signed)
[facility] Social Work Note:  Met with patient's daughter Carlyon Shadow, son-in-law Mortimer Fries and son Ray at bedside to confirm interest, explain hospice services and complete admission paperwork. All participated in conversation and agreed to service. Darlene completed paperwork which was delivered to [facility] admitting. Family shared some family history including multiple losses and associations they make with patient's CVA and approaching end of life. Darlene expressed appreciation for her spouse's supporting her desire to be present at hospital. They live in [place], have not left since they arrived here. Ray is finding comfort in being in patient's home where he reports he feels her presence more so than in the hospital. Acknowledged and affirmed individual efforts and encouraged acceptance of differences. Primary focus is on good symptom management. Darlene very attentive, rubbing patient with lotion and placing cook cloth on forehead. Ray was very appreciative of Dr. Garen Lah visit earlier this afternoon with explanation of patient's condition which gave family confidence responding to doubts and questions of family members who are not present to see patient. Family working on final arrangements and contemplating settling estate. Resource information offered. Family declined [facility] chaplain. Listened and encouraged individual expression of thoughts and feelings, offered grief education and hospital process eduction. Family aware [facility] hospital team collaborates with Dr. Hilma Favors daily. They were looking forward to Dr. Delanna Ahmadi visit this afternoon. Hope to follow up with family tomorrow. Thank you. Erling Conte, [place]

## 2013-08-21 NOTE — Progress Notes (Signed)
Paged and spoke with pt's Attending, Dr. Phillips OdorGolding, and notified her of pt's death. MD endorsed condolence to family and stated she would fill out death certificate in AM.

## 2013-08-21 NOTE — Discharge Summary (Signed)
Death Summary  Beckey DowningBarbara Metzger WJX:[id number] DOB: 03-Jan-1946 DOA: 08/15/2013  PCP: Isabella StallingNDIEGO,RICHARD M, MD PCP/Office notified: yes  Admit date: 08/12/2013 Date of Death: 2014-04-15  Final Diagnoses:  Active Problems:   CVA (cerebral infarction)  History of present illness:  68 yo woman with diabetes, hypertension and tobacco abuse was admitted on 4/10 with left sided weakness and confusion. She was found to have a large righ hemispheric stroke on MRI and subsequently deteriorated rapidly during her hospitalization. Goals of care were addressed in the setting of this sudden and severe stoke. Long term deficits would be permanently debilitating. Family opted for comfort care and PMT was consulted and [facility] admission requested for EOL care.    Hospital Course:  Admitted under [facility] on 4/13. She was placed on a continuous infusion of morphine and it was titrated for her comfort. Additionally she was given PRN ativan and scopalamine for secretions. Her NRB oxygen was weaned and she expired at 0104AM on 4/14 with her family at the bedside. RN pronounced and physician was notified. Death certificate completed and will be sent to funeral home. Chaplain provided support. Hospice will follow up with family.  Time: 30 minutes.  Signed:  Edsel Petrinlizabeth L Vail Basista  [facility] 2014-04-15, 5:18 PM

## 2013-08-21 NOTE — Progress Notes (Signed)
Chaplain responded to a page for grief support after pt passed away.  Pt's daughter was present at bedside and somewhat emotional.  Chaplain offered emotional support and grief support through empathetic listening and hospitality care.  Chaplain offered spiritual care through sacred texts and prayer.  Daughter will be calling other family members and requests that Chaplain be available if needed as other family members arrive.  Chaplain maybe paged as desired.     07/25/2013 0200  Clinical Encounter Type  Visited With Family;Health care provider  Visit Type Initial;Death  Referral From Nurse  Spiritual Encounters  Spiritual Needs Grief support;Emotional;Sacred text;Prayer  Stress Factors  Patient Stress Factors None identified  Family Stress Factors Other (Comment) (Death)    Sherrie SportVirginia Atha Muradyan, [address]haplain

## 2013-08-21 NOTE — H&P (Signed)
Patient: Kim DowningBarbara Chen DOB: 03/04/1946  MR#: [id number] Date of Admission: 07/31/2013 Palliative Medicine Team- Admission Note  Admission From: [id number]   Admitting DO/MD: Phillips OdorGolding Reason for Admission: Symptom Management secondary to large right hemispheric stroke   HPI: 68 [place] by [employer] and was according to family members at her baseline health and energy level last week. She had recently talked about retiring. She had been having issues with blood sugar control and an episode of syncope in preceding weeks and had seen her PCP-family report that [facility] but refused [facility] about a month ago with TIA symptoms. She was [facility]10 with slurred speech, confusion and left sided weakness, her symptoms rapidly progressed and she became unresponsive and unable to protect her airway-family stated that the patient had always told them if she had a stroke or debilitating illness to let her go-she never wanted to be in a nursing home. Patient expressed to her daughter recently that she thought she may be dying-she was very private and had high mistrust in healthcare.  MRI of her brain showed severe right hemisphere stroke- which would not be survivable with any meaningful quality of life -especially for this patient who had made her wishes known to family prior to this event.  PMT was consulted for goals of care and for comfort care transition-[facility] due to severity of her symptoms and inability to transport safely.  Medical/Family/Social History:  Past Medical History  Diagnosis Date  . Hypertension   . Stroke   . Diabetes mellitus without complication     History  Substance Use Topics  . Smoking status: Current Every Day Smoker  . Smokeless tobacco: Not on file  . Alcohol Use: No    No family history on file.  History   Social History  . Marital Status: Divorced   Spouse Name: N/A    Number of Children: N/A  . Years of Education: N/A   Occupational History  . Not on file.   Social History Main Topics  . Smoking status: Current Every Day Smoker  . Smokeless tobacco: Not on file  . Alcohol Use: No  . Drug Use: No  . Sexual Activity: Not on file   Other Topics Concern  . Not on file   Social History Narrative  . No narrative on file    Physical Exam:  Filed Vitals:   08/16/2013 2000  Pulse: 126  Temp:   Resp: 17   Patient is thin and frail, diaphoretic, unresponsive, tachypneic. Left side flacid with edema and flushing. Lungs are course with rhonchi, shallow respirations.  Allergies  Allergen Reactions  . Ampicillin Other (See Comments)    unknown    Current Medications: Scheduled Meds: . diazepam  5 mg Intravenous BID  . [START ON 08/06/2013] scopolamine  1 patch Transdermal Q72H   Continuous Infusions: . sodium chloride 10 mL/hr at 07/24/2013 1800  . morphine 4 mg/hr (07/25/2013 1704)   PRN Meds:.acetaminophen, atropine, ketorolac, LORazepam, morphine  Data:  Reviewed.   Assessment and Plan:   1. Acute Right Hemispheric CVA  Respiratory Failure  Seizure Risk High  Hemiparesis  Dysphagia/Aspiration  Symptoms: 1. Dyspnea 2. Pain 3. Agitation 4. Fever  Recommendations: 1. Admit under [facility] 2. Titrate morphine infusion for comfort 3. Tylenol PR and IV toradol for fever 4. Scop and atropine for secretions 5. Scheduled and PRN ativan for  agitation and seizure prophylaxis. 6 Transferred to Medical Floor  Hospice Team will support holistically. Patient requires aggressive, intensive treatment to control pain and/or symptoms. Care can not feasibly be provided in any other setting.   Start Time: 900AM End Time: 10AM Total Attending/NP time (in minutes): 50 minutes Greater than 50% if encounter time was spent in counseling and/or care coordination as documented above: yes  Anderson MaltaElizabeth Golding, DO Palliative  Medicine [phone]

## 2013-08-21 NOTE — Progress Notes (Signed)
@  0104 pt expired. Death confirmed by this RN (Matt Jex Strausbaugh) and Consulting civil engineerCharge RN Jorja Loa(Tim Irby) by inability to auscultate heart or breath sounds for 1 minute. Empathy provided to pt's family (Daughter Agustin CreeDarlene and Joanna PuffSon-in-Law Bobby at bedside) and Chaplain paged. [facility] called and pt is a possible tissue donor.  @0120  Awaiting Chaplain's exit from room to confirm daughter's contact information for CDS.

## 2013-08-21 NOTE — Progress Notes (Signed)
Removed IVs and other medical equipment/monitors at family request. Wasted approx. 75mL of Morphine gtt with Corrie Mckusickim Irby, RN. Bathed pt and prepared for funeral home. Pt family at bedside awaiting funeral home pickup.

## 2013-08-21 DEATH — deceased

## 2015-08-21 IMAGING — CT CT HEAD W/O CM
1 series · 16 of 30 positions shown, 20 images · non-contrast
Comparison: 07/08/2013

CLINICAL DATA: Code stroke, sudden weakness, slurred speech

EXAM:
CT HEAD WITHOUT CONTRAST
TECHNIQUE: Contiguous axial images were obtained from the base of the skull
through the vertex without intravenous contrast.

[Series 2: headtrauma 4.8 h37s · axial · 0.43mm/px · z∈[+52,+212]mm · 16 of 36 slices shown, 20 images]
[im 2/36  brain]
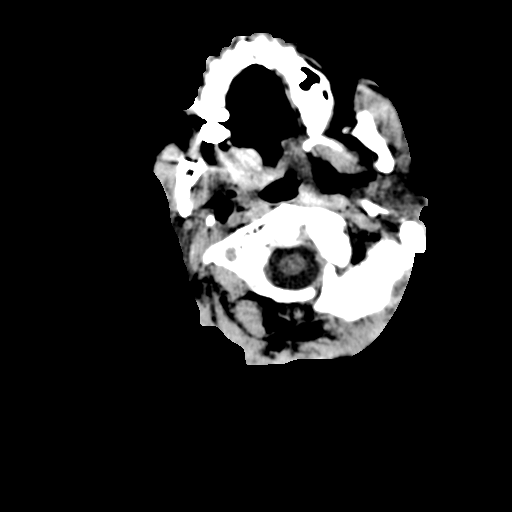
[im 2/36  bone]
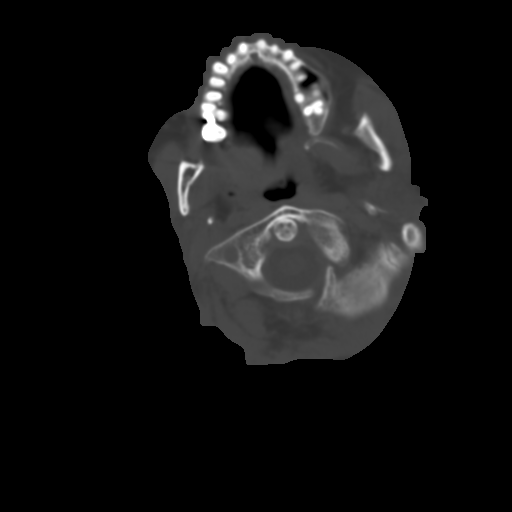
[im 4/36  brain]
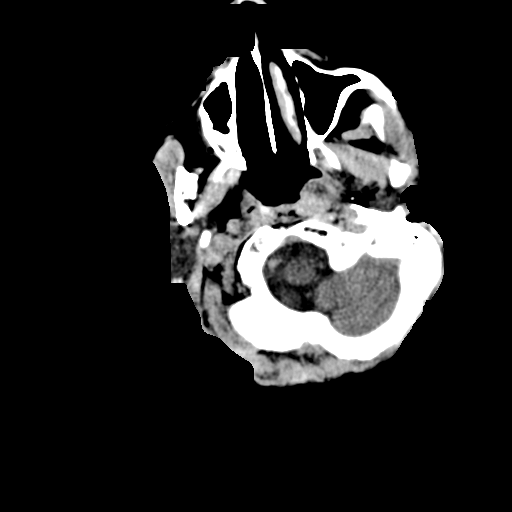
[im 7/36  brain]
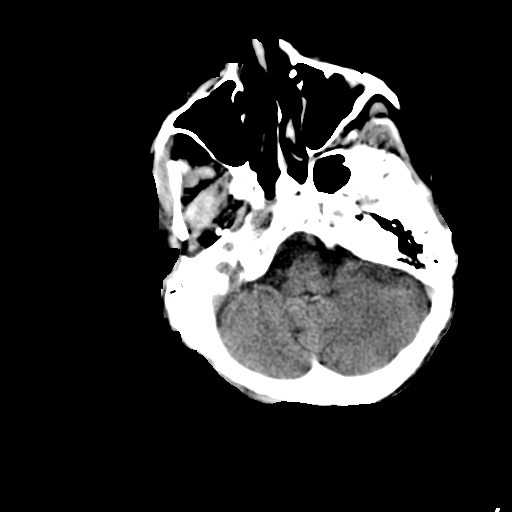
[im 9/36  brain]
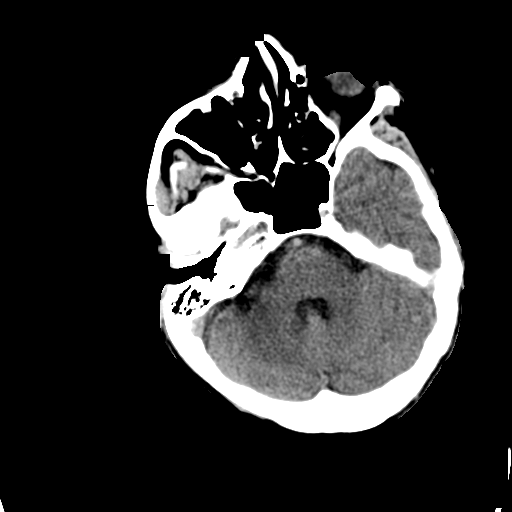
[im 10/36  brain]
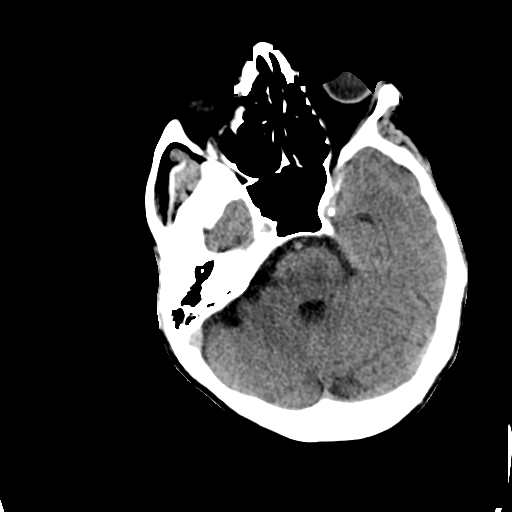
[im 10/36  bone]
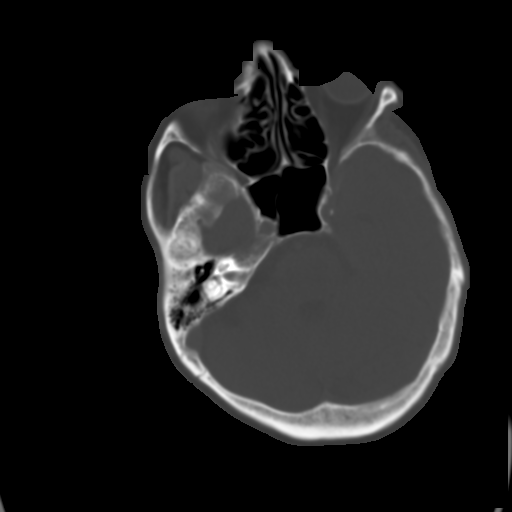
[im 13/36  brain]
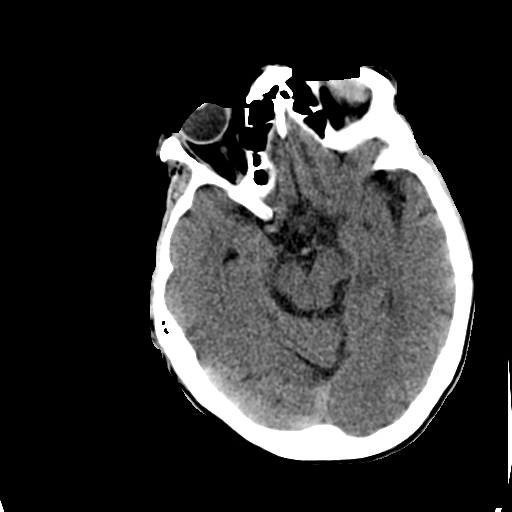
[im 15/36  brain]
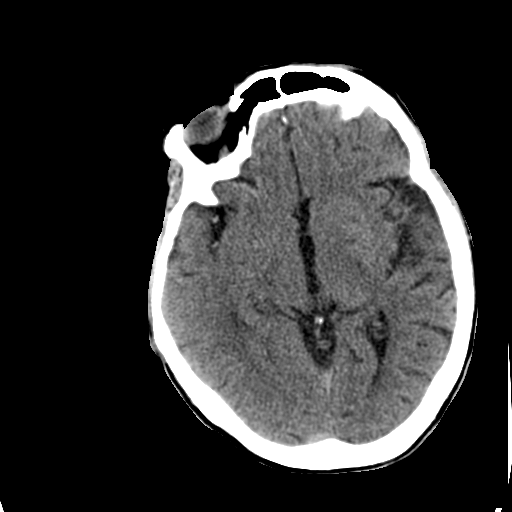
[im 17/36  brain]
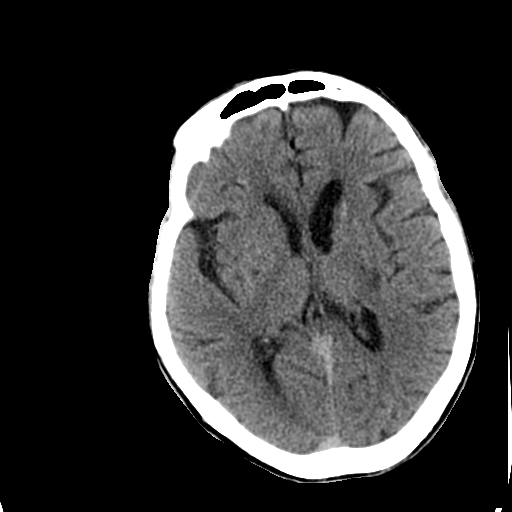
[im 19/36  brain]
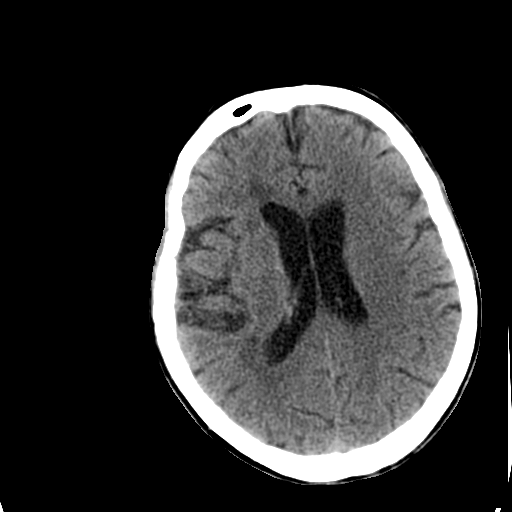
[im 19/36  bone]
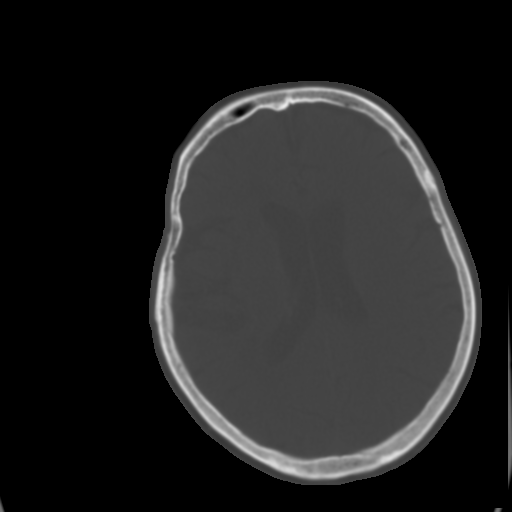
[im 21/36  brain]
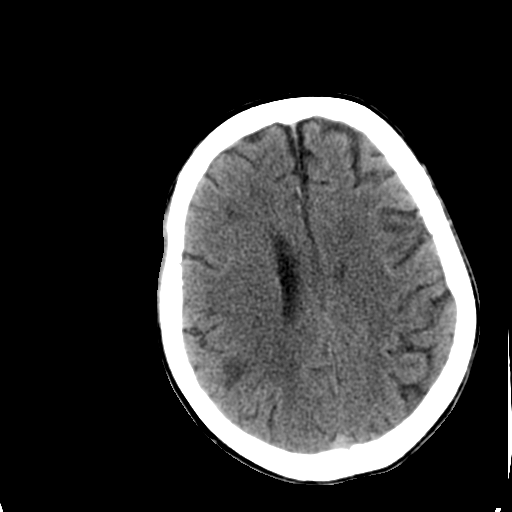
[im 23/36  brain]
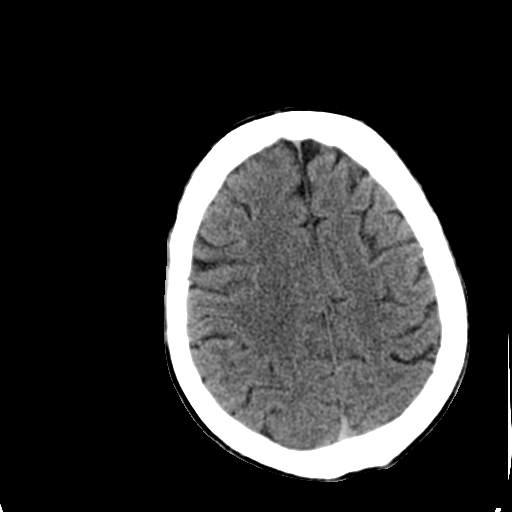
[im 26/36  brain]
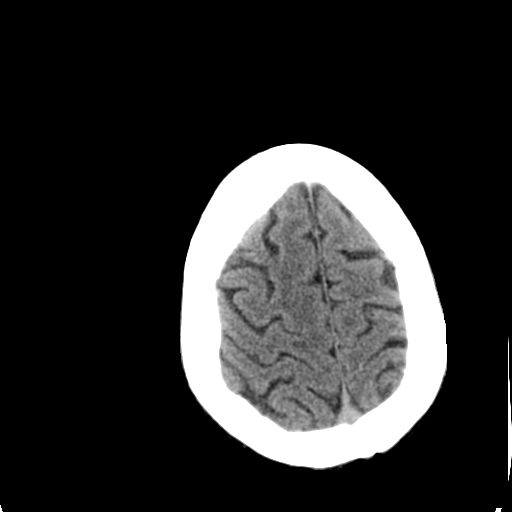
[im 27/36  brain]
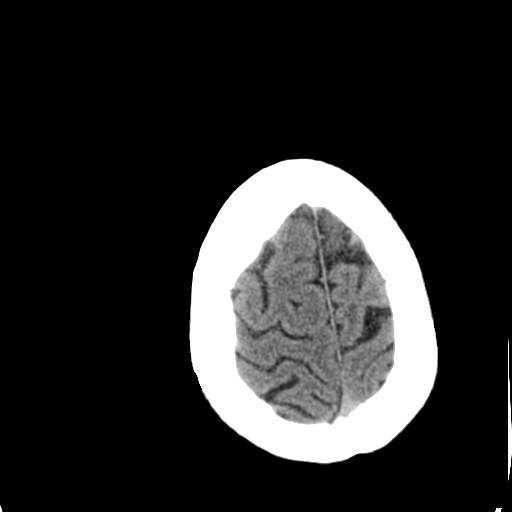
[im 27/36  bone]
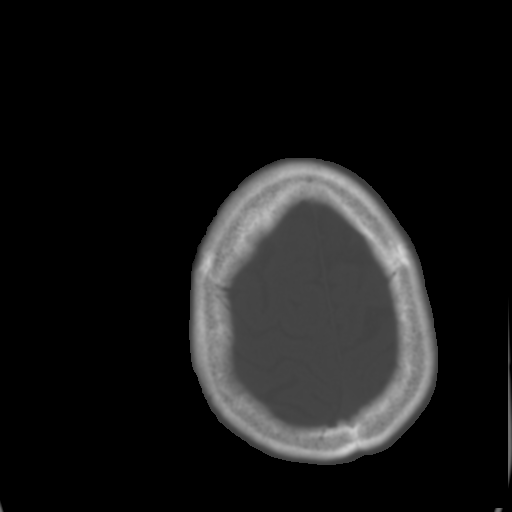
[im 29/36  brain]
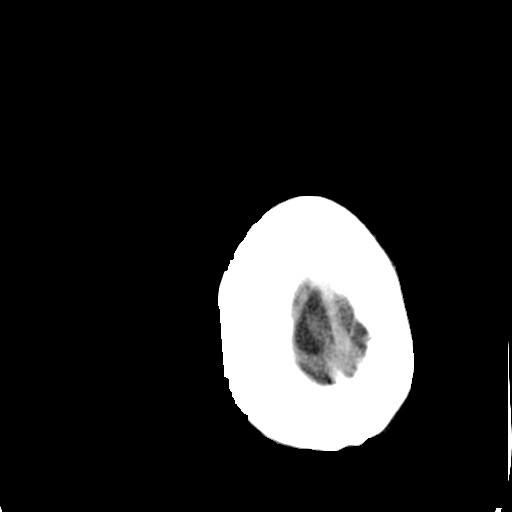
[im 32/36  brain]
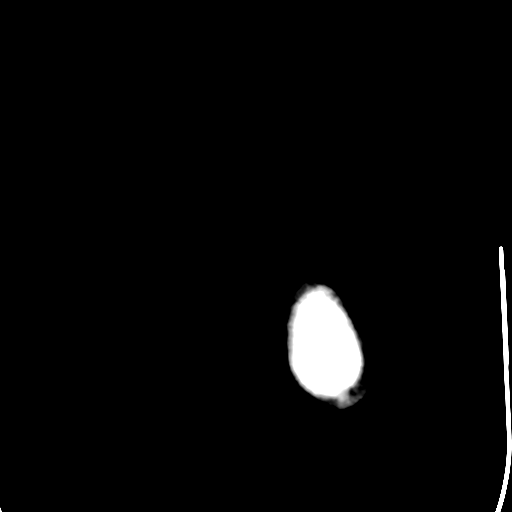
[im 34/36  brain]
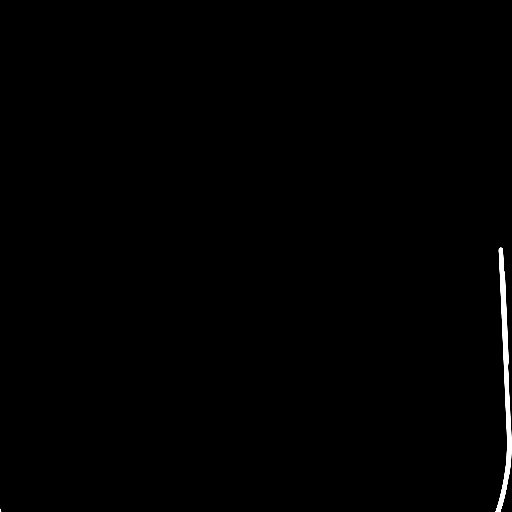

[16 of 30 positions shown; findings below may reference images not displayed]

FINDINGS: Mild generalized atrophy.

Normal ventricular morphology.

No midline shift or mass effect.

Scattered small vessel chronic ischemic changes of deep cerebral
white matter, most focal in right parietal region, stable.

No intracranial hemorrhage, mass lesion or evidence acute
infarction.

No extra-axial fluid collections.

Atherosclerotic calcification at the carotid siphons bilaterally.

Nasal septal deviation to the left.

No acute bone or sinus abnormalities.
IMPRESSION: Small vessel chronic ischemic changes of deep cerebral white matter.

No acute intracranial abnormalities.

Critical Value/emergent results were called by telephone at the time
of interpretation on 07/31/2013 at 4211 hr to Dr. DON LOLITO ONACRAM , who
verbally acknowledged these results.

## 2015-08-21 IMAGING — CT CT ANGIO HEAD
1 of 8 series · 7 of 47 positions shown · IV contrast (Omnipaque 300)
Comparison: CT HEAD W/O CM dated 07/31/2013

CLINICAL DATA: Code stroke, evaluate.

EXAM:
CT ANGIOGRAPHY HEAD
TECHNIQUE: Multidetector CT imaging of the head was performed using the
standard protocol during bolus administration of intravenous
contrast. Multiplanar CT image reconstructions and MIPs were
obtained to evaluate the vascular anatomy.
CONTRAST:  80mL OMNIPAQUE IOHEXOL 350 MG/ML SOLN

[Series 5: brain angio 2.0 pacs/portal · axial · portal-venous · 0.41mm/px · z∈[+212,+330]mm · 7 of 79 slices shown]
[im 10/79  brain]
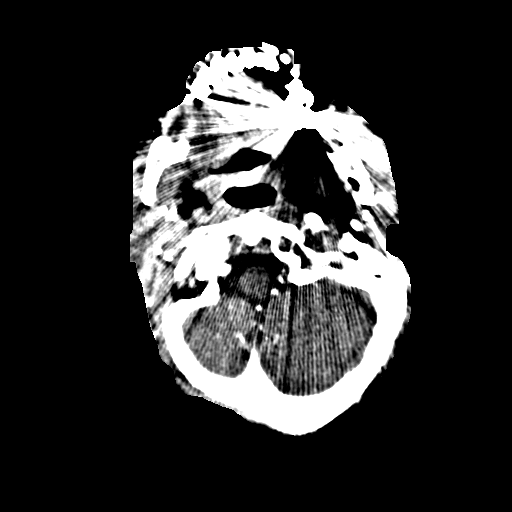
[im 20/79  bone]
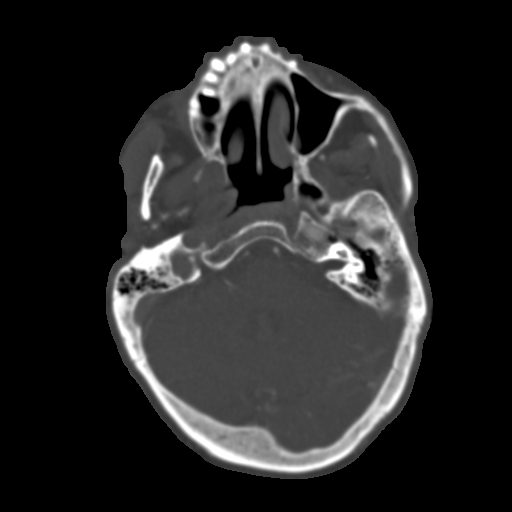
[im 30/79  brain]
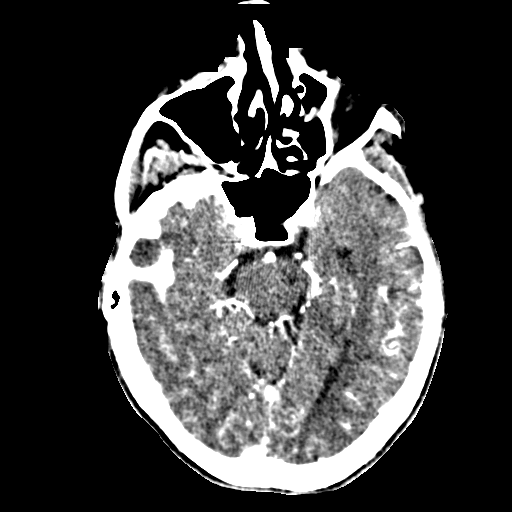
[im 40/79  bone]
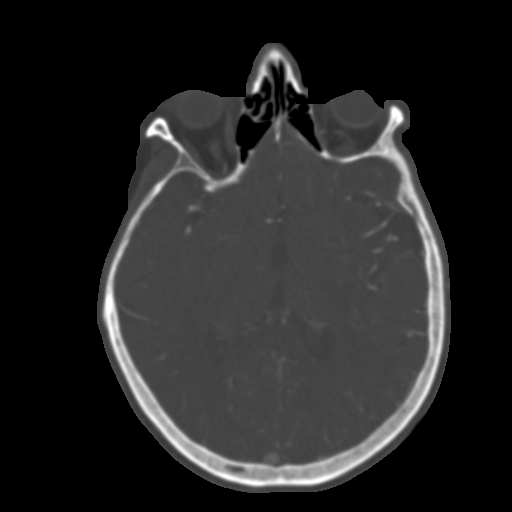
[im 49/79  brain]
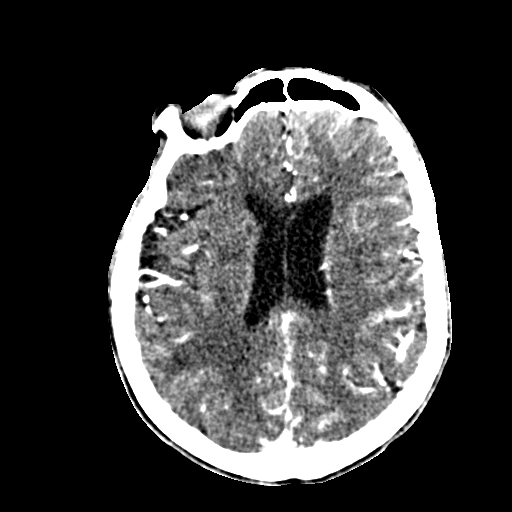
[im 59/79  bone]
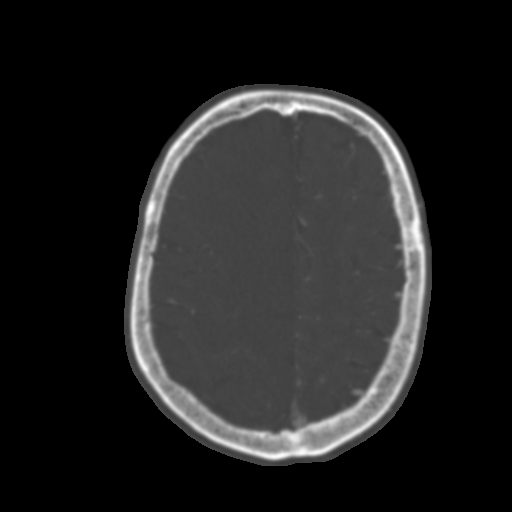
[im 69/79  brain]
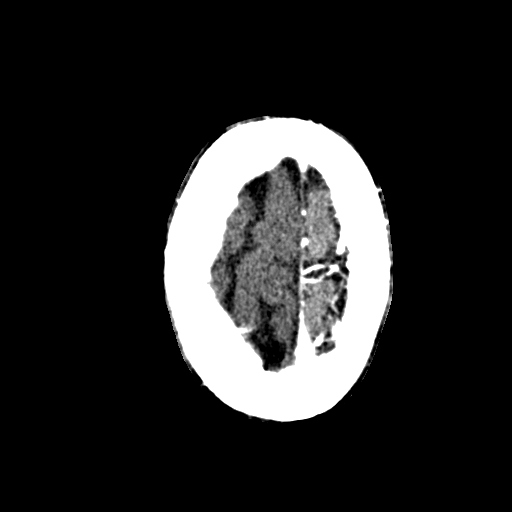

[7 of 47 positions shown; findings below may reference images not displayed]

FINDINGS: Anterior circulation: Complete loss of contrast opacification of the
included right cervical, petrous internal carotid artery. Mild hypo
dense contrast opacification of the right distal cavernous internal
carotid artery, with apparent high-grade stenosis of the anterior
genu of the right internal carotid artery, axial 35/79. Robust right
carotid terminus contrast opacification, with tiny posterior
communicating artery and diminutive but patent left A1 segment.
Robust left posterior communicating artery is present. Supernumerary
anterior cerebral artery with patent proximal anterior and middle
cerebral arteries, however there is paucity of distal right middle
cerebral artery and anterior cerebral artery vessels, which may slow
flow.

Posterior circulation: Right vertebral artery is dominant, the left
vertebral artery predominantly terminates in left posterior-inferior
cerebellar artery. Patent basilar artery and main branch vessels.
Normal appearance of the posterior cerebral arteries.

No aneurysm, no suspicious luminal irregularity.

Slight subcentimeter wedge-like enhancement of the right occipital
(axial 20/32, series 7) and right inferior frontal lobe (axial
19/32, series 7), of unclear clinical significance.

Review of the MIP images confirms the above findings.
IMPRESSION: Right internal carotid artery occlusion with retrograde flow via
posterior and anterior communicating arteries. This could be further
characterized with dedicated CT angiogram of the neck.

Apparent high-grade stenosis of the anterior genu of the right
internal carotid artery.

Relative paucity of distal anterior and middle cerebral artery
vessels, favored to reflect delayed flow due to the proximal ICA
occlusion.

Very slight enhancement of the right inferior frontal lobe and right
occipital lung may reflect partial voluming of cortical veins or
possibly subacute ischemia, and would be better characterized on MRI
of the brain with contrast as clinically indicated.

Acute findings discussed with and reconfirmed by Dr.ROMEO TK MARUANO
on07/31/2013at[DATE].

  By: Nomasibulele Moatshe
